# Patient Record
Sex: Male | Born: 1970 | Race: Black or African American | Hispanic: No | Marital: Married | Smoking: Never smoker
Health system: Southern US, Community
[De-identification: ages and names within clinical notes are randomized; demographics above are authoritative.]

## PROBLEM LIST (undated history)

## (undated) DIAGNOSIS — I10 Essential (primary) hypertension: Secondary | ICD-10-CM

## (undated) HISTORY — PX: KNEE ARTHROSCOPY: SHX127

---

## 2001-03-29 ENCOUNTER — Other Ambulatory Visit: Admission: RE | Admit: 2001-03-29 | Discharge: 2001-03-29 | Payer: Self-pay

## 2002-03-19 ENCOUNTER — Emergency Department (HOSPITAL_COMMUNITY): Admission: EM | Admit: 2002-03-19 | Discharge: 2002-03-19 | Payer: Self-pay | Admitting: *Deleted

## 2006-04-15 ENCOUNTER — Emergency Department (HOSPITAL_COMMUNITY): Admission: EM | Admit: 2006-04-15 | Discharge: 2006-04-15 | Payer: Self-pay | Admitting: Emergency Medicine

## 2006-12-09 ENCOUNTER — Emergency Department (HOSPITAL_COMMUNITY): Admission: EM | Admit: 2006-12-09 | Discharge: 2006-12-09 | Payer: Self-pay | Admitting: Emergency Medicine

## 2010-02-04 ENCOUNTER — Emergency Department (HOSPITAL_COMMUNITY): Admission: EM | Admit: 2010-02-04 | Discharge: 2010-02-04 | Payer: Self-pay | Admitting: Emergency Medicine

## 2010-02-06 ENCOUNTER — Ambulatory Visit (HOSPITAL_COMMUNITY): Admission: RE | Admit: 2010-02-06 | Discharge: 2010-02-06 | Payer: Self-pay | Admitting: Internal Medicine

## 2010-04-26 ENCOUNTER — Other Ambulatory Visit (HOSPITAL_COMMUNITY): Payer: Self-pay | Admitting: Orthopaedic Surgery

## 2010-04-26 ENCOUNTER — Ambulatory Visit (HOSPITAL_COMMUNITY): Admission: RE | Admit: 2010-04-26 | Payer: Self-pay | Source: Home / Self Care | Admitting: Orthopaedic Surgery

## 2010-04-26 DIAGNOSIS — M542 Cervicalgia: Secondary | ICD-10-CM

## 2010-05-01 ENCOUNTER — Encounter: Payer: Self-pay | Admitting: Orthopaedic Surgery

## 2010-05-03 ENCOUNTER — Ambulatory Visit (HOSPITAL_COMMUNITY)
Admission: RE | Admit: 2010-05-03 | Discharge: 2010-05-03 | Disposition: A | Discharge status: Home / Self Care | Payer: PRIVATE HEALTH INSURANCE | Source: Ambulatory Visit | Attending: Orthopaedic Surgery | Admitting: Orthopaedic Surgery

## 2010-05-03 DIAGNOSIS — M542 Cervicalgia: Secondary | ICD-10-CM | POA: Insufficient documentation

## 2010-05-03 DIAGNOSIS — M5380 Other specified dorsopathies, site unspecified: Secondary | ICD-10-CM | POA: Insufficient documentation

## 2010-05-03 DIAGNOSIS — M5124 Other intervertebral disc displacement, thoracic region: Secondary | ICD-10-CM | POA: Insufficient documentation

## 2010-05-03 DIAGNOSIS — M5020 Other cervical disc displacement, unspecified cervical region: Secondary | ICD-10-CM | POA: Insufficient documentation

## 2010-05-29 ENCOUNTER — Ambulatory Visit: Payer: Self-pay | Admitting: Anesthesiology

## 2010-06-25 ENCOUNTER — Ambulatory Visit: Payer: Self-pay | Admitting: Anesthesiology

## 2010-07-25 ENCOUNTER — Ambulatory Visit: Payer: Self-pay | Admitting: Anesthesiology

## 2010-10-16 ENCOUNTER — Emergency Department (HOSPITAL_COMMUNITY)
Admission: EM | Admit: 2010-10-16 | Discharge: 2010-10-16 | Disposition: A | Discharge status: Home / Self Care | Payer: PRIVATE HEALTH INSURANCE | Attending: Emergency Medicine | Admitting: Emergency Medicine

## 2010-10-16 ENCOUNTER — Encounter: Payer: Self-pay | Admitting: *Deleted

## 2010-10-16 DIAGNOSIS — Z0000 Encounter for general adult medical examination without abnormal findings: Secondary | ICD-10-CM

## 2010-10-16 DIAGNOSIS — I10 Essential (primary) hypertension: Secondary | ICD-10-CM | POA: Insufficient documentation

## 2010-10-16 DIAGNOSIS — Z711 Person with feared health complaint in whom no diagnosis is made: Secondary | ICD-10-CM | POA: Insufficient documentation

## 2010-10-16 HISTORY — DX: Essential (primary) hypertension: I10

## 2010-10-16 NOTE — ED Notes (Signed)
Pt gave himself the shot tonight.

## 2010-10-16 NOTE — ED Provider Notes (Signed)
History     Chief Complaint  Patient presents with  . Foreign Body in Skin   HPI Comments: Patient gave himself a testosterone shot in the left buttock yesterday. Did not see the needle after giving himself the injection. He has the packaging from the syringe. It has a retracting needle mechanism. He is having no pain at the injection site, there is no palpable foreign body. No focal eryhtema or swelling.  Patient is a 40 y.o. male presenting with foreign body. The history is provided by the patient.  Foreign Body  The current episode started yesterday. Intake: gave himself a testosterone shot, doesn't know if needle is still in his buttock. Associated symptoms comments: No symptoms, no pain at the site of injection, no erythema. He has received no recent medical care.    Past Medical History  Diagnosis Date  . Hypertension     History reviewed. No pertinent past surgical history.  History reviewed. No pertinent family history.  History  Substance Use Topics  . Smoking status: Current Everyday Smoker  . Smokeless tobacco: Not on file  . Alcohol Use: No      Review of Systems  All other systems reviewed and are negative.    Physical Exam  BP 127/88  Pulse 84  Temp 97.8 F (36.6 C)  Resp 20  Ht 6\' 1"  (1.854 m)  Wt 190 lb (86.183 kg)  BMI 25.07 kg/m2  SpO2 99%  Physical Exam  Nursing note and vitals reviewed. Constitutional: He is oriented to person, place, and time. He appears well-developed and well-nourished. No distress.  HENT:  Head: Normocephalic and atraumatic.  Eyes: EOM are normal.  Cardiovascular: Normal rate and normal heart sounds.   Pulmonary/Chest: Effort normal and breath sounds normal.  Genitourinary:       Left buttock with no obvious signs of injection at site indicated by patient.  Neurological: He is alert and oriented to person, place, and time.  Skin: Skin is warm and dry.    ED Course  Procedures  MDM       Nicoletta Dress. Colon Branch,  MD 10/16/10 [phone]

## 2010-10-16 NOTE — ED Notes (Signed)
Pt gives himself a testosterone shot q 4wks and he believes the needle may still be in his buttocks.

## 2012-02-19 ENCOUNTER — Ambulatory Visit: Payer: Self-pay | Admitting: Orthopedic Surgery

## 2013-12-13 ENCOUNTER — Ambulatory Visit (INDEPENDENT_AMBULATORY_CARE_PROVIDER_SITE_OTHER): Payer: 59 | Admitting: Urology

## 2013-12-13 DIAGNOSIS — R300 Dysuria: Secondary | ICD-10-CM

## 2013-12-13 DIAGNOSIS — N529 Male erectile dysfunction, unspecified: Secondary | ICD-10-CM

## 2013-12-13 DIAGNOSIS — R972 Elevated prostate specific antigen [PSA]: Secondary | ICD-10-CM

## 2014-02-28 ENCOUNTER — Ambulatory Visit (INDEPENDENT_AMBULATORY_CARE_PROVIDER_SITE_OTHER): Payer: 59 | Admitting: Urology

## 2014-02-28 ENCOUNTER — Other Ambulatory Visit: Payer: Self-pay | Admitting: Urology

## 2014-02-28 DIAGNOSIS — R972 Elevated prostate specific antigen [PSA]: Secondary | ICD-10-CM

## 2014-03-30 ENCOUNTER — Other Ambulatory Visit: Payer: Self-pay | Admitting: Urology

## 2014-03-30 DIAGNOSIS — R972 Elevated prostate specific antigen [PSA]: Secondary | ICD-10-CM

## 2014-05-02 ENCOUNTER — Ambulatory Visit (HOSPITAL_COMMUNITY)
Admission: RE | Admit: 2014-05-02 | Discharge: 2014-05-02 | Disposition: A | Discharge status: Home / Self Care | Payer: 59 | Source: Ambulatory Visit | Attending: Urology | Admitting: Urology

## 2014-05-02 ENCOUNTER — Encounter (HOSPITAL_COMMUNITY): Payer: Self-pay

## 2014-05-02 DIAGNOSIS — C61 Malignant neoplasm of prostate: Secondary | ICD-10-CM | POA: Diagnosis not present

## 2014-05-02 DIAGNOSIS — R972 Elevated prostate specific antigen [PSA]: Secondary | ICD-10-CM | POA: Insufficient documentation

## 2014-05-02 MED ORDER — LIDOCAINE HCL (PF) 2 % IJ SOLN
INTRAMUSCULAR | Status: AC
  Administered 2014-05-02: 10 mL
  Filled 2014-05-02: qty 10

## 2014-05-02 MED ORDER — LIDOCAINE HCL (PF) 2 % IJ SOLN
10.0000 mL | Freq: Once | INTRAMUSCULAR | Status: AC
  Administered 2014-05-02: 10 mL

## 2014-05-02 MED ORDER — GENTAMICIN SULFATE 40 MG/ML IJ SOLN
160.0000 mg | Freq: Once | INTRAMUSCULAR | Status: AC
  Administered 2014-05-02: 160 mg via INTRAMUSCULAR

## 2014-05-02 MED ORDER — GENTAMICIN SULFATE 40 MG/ML IJ SOLN
INTRAMUSCULAR | Status: AC
  Administered 2014-05-02: 160 mg via INTRAMUSCULAR
  Filled 2014-05-02: qty 4

## 2014-05-02 NOTE — Discharge Instructions (Signed)
Transrectal Ultrasound-Guided Biopsy °A transrectal ultrasound-guided biopsy is a procedure to remove samples of tissue from your prostate using ultrasound images to guide the procedure. The procedure is usually done to evaluate the prostate gland of men who have an elevated prostate-specific antigen (PSA). PSA is a blood test to screen for prostate cancer. The biopsy samples are taken to check for prostate cancer.  °LET YOUR HEALTH CARE PROVIDER KNOW ABOUT: °· Any allergies you have. °· All medicines you are taking, including vitamins, herbs, eye drops, creams, and over-the-counter medicines. °· Previous problems you or members of your family have had with the use of anesthetics. °· Any blood disorders you have. °· Previous surgeries you have had. °· Medical conditions you have. °RISKS AND COMPLICATIONS °Generally, this is a safe procedure. However, as with any procedure, problems can occur. Possible problems include: °· Infection of your prostate. °· Bleeding from your rectum or blood in your urine. °· Difficulty urinating. °· Nerve damage (this is usually temporary). °· Damage to surrounding structures such as blood vessels, organs, and muscles, which would require other procedures. °BEFORE THE PROCEDURE °· Do not eat or drink anything after midnight on the night before the procedure or as directed by your health care provider. °· Take medicines only as directed by your health care provider. °· Your health care provider may have you stop taking certain medicines 5-7 days before the procedure. °· You will be given an enema before the procedure. During an enema, a liquid is injected into your rectum to clear out waste. °· You may have lab tests the day of your procedure.   °· Plan to have someone take you home after the procedure. °PROCEDURE  °· You will be given medicine to help you relax (sedative) before the procedure. An IV tube will be inserted into one of your veins and used to give fluids and  medicine. °· You will be given antibiotic medicine to reduce the risk of an infection. °· You will be placed on your side for the procedure. °· A probe with lubricated gel will be placed into your rectum, and images will be taken of your prostate and surrounding structures. °· Numbing medicine will be injected into the prostate before the biopsy samples are taken. °· A biopsy needle will then be inserted and guided to your prostate with the use of the ultrasound images. °· Samples of prostate tissue will be taken, and the needle will then be removed. °· The biopsy samples will be sent to a lab to be analyzed. Results are usually back in 2-3 days. °AFTER THE PROCEDURE °· You will be taken to a recovery area where you will be monitored. °· You may have some discomfort in the rectal area. You will be given pain medicines to control this. °· You may be allowed to go home the same day, or you may need to stay in the hospital overnight. °Document Released: 08/01/2013 Document Reviewed: 11/03/2012 °[employer]® Patient Information ©2015 [employer]. This information is not intended to replace advice given to you by your health care provider. Make sure you discuss any questions you have with your health care provider. ° °

## 2014-10-19 ENCOUNTER — Other Ambulatory Visit: Payer: Self-pay | Admitting: Urology

## 2014-10-19 DIAGNOSIS — C61 Malignant neoplasm of prostate: Secondary | ICD-10-CM

## 2014-11-08 ENCOUNTER — Ambulatory Visit (HOSPITAL_COMMUNITY)
Admission: RE | Admit: 2014-11-08 | Discharge: 2014-11-08 | Disposition: A | Discharge status: Home / Self Care | Payer: 59 | Source: Ambulatory Visit | Attending: Urology | Admitting: Urology

## 2014-11-08 DIAGNOSIS — C61 Malignant neoplasm of prostate: Secondary | ICD-10-CM | POA: Diagnosis not present

## 2014-11-08 LAB — POCT I-STAT CREATININE: CREATININE: 1.1 mg/dL (ref 0.61–1.24)

## 2014-11-08 MED ORDER — GADOBENATE DIMEGLUMINE 529 MG/ML IV SOLN
20.0000 mL | Freq: Once | INTRAVENOUS | Status: AC | PRN
  Administered 2014-11-08: 18 mL via INTRAVENOUS

## 2015-05-09 DIAGNOSIS — Z79899 Other long term (current) drug therapy: Secondary | ICD-10-CM | POA: Diagnosis not present

## 2015-05-09 DIAGNOSIS — I10 Essential (primary) hypertension: Secondary | ICD-10-CM | POA: Diagnosis not present

## 2015-05-09 DIAGNOSIS — C61 Malignant neoplasm of prostate: Secondary | ICD-10-CM | POA: Diagnosis not present

## 2015-05-09 DIAGNOSIS — D075 Carcinoma in situ of prostate: Secondary | ICD-10-CM | POA: Diagnosis not present

## 2015-05-18 DIAGNOSIS — N528 Other male erectile dysfunction: Secondary | ICD-10-CM | POA: Diagnosis not present

## 2015-05-18 DIAGNOSIS — Z0001 Encounter for general adult medical examination with abnormal findings: Secondary | ICD-10-CM | POA: Diagnosis not present

## 2015-05-18 DIAGNOSIS — Z8546 Personal history of malignant neoplasm of prostate: Secondary | ICD-10-CM | POA: Diagnosis not present

## 2015-05-18 DIAGNOSIS — Z6825 Body mass index (BMI) 25.0-25.9, adult: Secondary | ICD-10-CM | POA: Diagnosis not present

## 2015-05-19 DIAGNOSIS — H524 Presbyopia: Secondary | ICD-10-CM | POA: Diagnosis not present

## 2015-05-23 DIAGNOSIS — C61 Malignant neoplasm of prostate: Secondary | ICD-10-CM | POA: Diagnosis not present

## 2015-05-23 DIAGNOSIS — Z0000 Encounter for general adult medical examination without abnormal findings: Secondary | ICD-10-CM | POA: Diagnosis not present

## 2015-10-10 DIAGNOSIS — M7581 Other shoulder lesions, right shoulder: Secondary | ICD-10-CM | POA: Diagnosis not present

## 2015-11-13 DIAGNOSIS — C61 Malignant neoplasm of prostate: Secondary | ICD-10-CM | POA: Diagnosis not present

## 2015-11-21 DIAGNOSIS — N5201 Erectile dysfunction due to arterial insufficiency: Secondary | ICD-10-CM | POA: Diagnosis not present

## 2015-11-21 DIAGNOSIS — C61 Malignant neoplasm of prostate: Secondary | ICD-10-CM | POA: Diagnosis not present

## 2015-11-27 DIAGNOSIS — I10 Essential (primary) hypertension: Secondary | ICD-10-CM | POA: Diagnosis not present

## 2016-01-07 ENCOUNTER — Encounter: Payer: Self-pay | Admitting: Anesthesiology

## 2016-01-07 ENCOUNTER — Ambulatory Visit: Payer: 59 | Attending: Anesthesiology | Admitting: Anesthesiology

## 2016-01-07 VITALS — BP 124/81 | HR 63 | Temp 98.0°F | Resp 16 | Ht 73.0 in | Wt 190.0 lb

## 2016-01-07 DIAGNOSIS — M5136 Other intervertebral disc degeneration, lumbar region: Secondary | ICD-10-CM | POA: Insufficient documentation

## 2016-01-07 DIAGNOSIS — F17200 Nicotine dependence, unspecified, uncomplicated: Secondary | ICD-10-CM | POA: Insufficient documentation

## 2016-01-07 DIAGNOSIS — M25511 Pain in right shoulder: Secondary | ICD-10-CM | POA: Insufficient documentation

## 2016-01-07 DIAGNOSIS — I10 Essential (primary) hypertension: Secondary | ICD-10-CM | POA: Diagnosis not present

## 2016-01-07 DIAGNOSIS — M7918 Myalgia, other site: Secondary | ICD-10-CM

## 2016-01-07 DIAGNOSIS — M542 Cervicalgia: Secondary | ICD-10-CM | POA: Diagnosis not present

## 2016-01-07 DIAGNOSIS — M791 Myalgia: Secondary | ICD-10-CM | POA: Diagnosis not present

## 2016-01-07 MED ORDER — ROPIVACAINE HCL 2 MG/ML IJ SOLN: 1.0000 mL | Freq: Once | INTRAMUSCULAR | Status: AC

## 2016-01-07 MED ORDER — DEXAMETHASONE SODIUM PHOSPHATE 10 MG/ML IJ SOLN
INTRAMUSCULAR | Status: AC
  Filled 2016-01-07: qty 1

## 2016-01-07 MED ORDER — ROPIVACAINE HCL 2 MG/ML IJ SOLN
INTRAMUSCULAR | Status: AC
  Filled 2016-01-07: qty 10

## 2016-01-07 MED ORDER — DEXAMETHASONE SODIUM PHOSPHATE 10 MG/ML IJ SOLN: 10.0000 mg | Freq: Once | INTRAMUSCULAR | Status: AC

## 2016-01-07 NOTE — Patient Instructions (Addendum)
General Discharge Instructions :  If you need to reach your doctor call: Monday-Friday 8:00 am - 4:00 pm at [phone] or toll free [phone].  After clinic hours [phone] to have operator reach doctor.  Bring all of your medication bottles to all your appointments in the pain clinic.  To cancel or reschedule your appointment with Pain Management please remember to call 24 hours in advance to avoid a fee.  Refer to the educational materials which you have been given on: General Risks, I had my Procedure. Discharge Instructions, Post Sedation.  Post Procedure Instructions:  The drugs you were given will stay in your system until tomorrow, so for the next 24 hours you should not drive, make any legal decisions or drink any alcoholic beverages.  You may eat anything you prefer, but it is better to start with liquids then soups and crackers, and gradually work up to solid foods.  Please notify your doctor immediately if you have any unusual bleeding, trouble breathing or pain that is not related to your normal pain.  Depending on the type of procedure that was done, some parts of your body may feel week and/or numb.  This usually clears up by tonight or the next day.  Walk with the use of an assistive device or accompanied by an adult for the 24 hours.  You may use ice on the affected area for the first 24 hours.  Put ice in a Ziploc bag and cover with a towel and place against area 15 minutes on 15 minutes off.  You may switch to heat after 24 hours. Trigger Point Injection Trigger points are areas where you have muscle pain. A trigger point injection is a shot given in the trigger point to relieve that pain. A trigger point might feel like a knot in your muscle. It hurts to press on a trigger point. Sometimes the pain spreads out (radiates) to other parts of the body. For example, pressing on a trigger point in your shoulder might cause pain in your arm or neck. You might have one  trigger point. Or, you might have more than one. People often have trigger points in their upper back and lower back. They also occur often in the neck and shoulders. Pain from a trigger point lasts for a long time. It can make it hard to keep moving. You might not be able to do the exercise or physical therapy that could help you deal with the pain. A trigger point injection may help. It does not work for everyone. But, it may relieve your pain for a few days or a few months. A trigger point injection does not cure long-lasting (chronic) pain. LET YOUR CAREGIVER KNOW ABOUT: Any allergies (especially to latex, lidocaine, or steroids). Blood-thinning medicines that you take. These drugs can lead to bleeding or bruising after an injection. They include: Aspirin. Ibuprofen. Clopidogrel. Warfarin. Other medicines you take. This includes all vitamins, herbs, eyedrops, over-the-counter medicines, and creams. Use of steroids. Recent infections. Past problems with numbing medicines. Bleeding problems. Surgeries you have had. Other health problems. RISKS AND COMPLICATIONS A trigger point injection is a safe treatment. However, problems may develop, such as: Minor side effects usually go away in 1 to 2 days. These may include: Soreness. Bruising. Stiffness. More serious problems are rare. But, they may include: Bleeding under the skin (hematoma). Skin infection. Breaking off of the needle under your skin. Lung puncture. The trigger point injection may not work for you. BEFORE THE PROCEDURE  You may need to stop taking any medicine that thins your blood. This is to prevent bleeding and bruising. Usually these medicines are stopped several days before the injection. No other preparation is needed. PROCEDURE  A trigger point injection can be given in your caregiver's office or in a clinic. Each injection takes 2 minutes or less. Your caregiver will feel for trigger points. The caregiver may use a  marker to circle the area for the injection. The skin over the trigger point will be washed with a germ-killing (antiseptic) solution. The caregiver pinches the spot for the injection. Then, a very thin needle is used for the shot. You may feel pain or a twitching feeling when the needle enters the trigger point. A numbing solution may be injected into the trigger point. Sometimes a drug to keep down swelling, redness, and warmth (inflammation) is also injected. Your caregiver moves the needle around the trigger zone until the tightness and twitching goes away. After the injection, your caregiver may put gentle pressure over the injection site. Then it is covered with a bandage. AFTER THE PROCEDURE You can go right home after the injection. The bandage can be taken off after a few hours. You may feel sore and stiff for 1 to 2 days. Go back to your regular activities slowly. Your caregiver may ask you to stretch your muscles. Do not do anything that takes extra energy for a few days. Follow your caregiver's instructions to manage and treat other pain.   This information is not intended to replace advice given to you by your health care provider. Make sure you discuss any questions you have with your health care provider.   Document Released: 03/06/2011 Document Revised: 07/12/2012 Document Reviewed: 03/06/2011 Elsevier Interactive Patient Education 2016 [employer] Pain Management Discharge Instructions  General Discharge Instructions :  If you need to reach your doctor call: Monday-Friday 8:00 am - 4:00 pm at [phone] or toll free [phone].  After clinic hours [phone] to have operator reach doctor.  Bring all of your medication bottles to all your appointments in the pain clinic.  To cancel or reschedule your appointment with Pain Management please remember to call 24 hours in advance to avoid a fee.  Refer to the educational materials which you have been given on:  General Risks, I had my Procedure. Discharge Instructions, Post Sedation.  Post Procedure Instructions:  The drugs you were given will stay in your system until tomorrow, so for the next 24 hours you should not drive, make any legal decisions or drink any alcoholic beverages.  You may eat anything you prefer, but it is better to start with liquids then soups and crackers, and gradually work up to solid foods.  Please notify your doctor immediately if you have any unusual bleeding, trouble breathing or pain that is not related to your normal pain.  Depending on the type of procedure that was done, some parts of your body may feel week and/or numb.  This usually clears up by tonight or the next day.  Walk with the use of an assistive device or accompanied by an adult for the 24 hours.  You may use ice on the affected area for the first 24 hours.  Put ice in a Ziploc bag and cover with a towel and place against area 15 minutes on 15 minutes off.  You may switch to heat after 24 hours. Trigger Point Injection Trigger points are areas where you have muscle pain. A trigger point  injection is a shot given in the trigger point to relieve that pain. A trigger point might feel like a knot in your muscle. It hurts to press on a trigger point. Sometimes the pain spreads out (radiates) to other parts of the body. For example, pressing on a trigger point in your shoulder might cause pain in your arm or neck. You might have one trigger point. Or, you might have more than one. People often have trigger points in their upper back and lower back. They also occur often in the neck and shoulders. Pain from a trigger point lasts for a long time. It can make it hard to keep moving. You might not be able to do the exercise or physical therapy that could help you deal with the pain. A trigger point injection may help. It does not work for everyone. But, it may relieve your pain for a few days or a few months. A trigger  point injection does not cure long-lasting (chronic) pain. LET YOUR CAREGIVER KNOW ABOUT:  Any allergies (especially to latex, lidocaine, or steroids).  Blood-thinning medicines that you take. These drugs can lead to bleeding or bruising after an injection. They include:  Aspirin.  Ibuprofen.  Clopidogrel.  Warfarin.  Other medicines you take. This includes all vitamins, herbs, eyedrops, over-the-counter medicines, and creams.  Use of steroids.  Recent infections.  Past problems with numbing medicines.  Bleeding problems.  Surgeries you have had.  Other health problems. RISKS AND COMPLICATIONS A trigger point injection is a safe treatment. However, problems may develop, such as:  Minor side effects usually go away in 1 to 2 days. These may include:  Soreness.  Bruising.  Stiffness.  More serious problems are rare. But, they may include:  Bleeding under the skin (hematoma).  Skin infection.  Breaking off of the needle under your skin.  Lung puncture.  The trigger point injection may not work for you. BEFORE THE PROCEDURE You may need to stop taking any medicine that thins your blood. This is to prevent bleeding and bruising. Usually these medicines are stopped several days before the injection. No other preparation is needed. PROCEDURE  A trigger point injection can be given in your caregiver's office or in a clinic. Each injection takes 2 minutes or less.  Your caregiver will feel for trigger points. The caregiver may use a marker to circle the area for the injection.  The skin over the trigger point will be washed with a germ-killing (antiseptic) solution.  The caregiver pinches the spot for the injection.  Then, a very thin needle is used for the shot. You may feel pain or a twitching feeling when the needle enters the trigger point.  A numbing solution may be injected into the trigger point. Sometimes a drug to keep down swelling, redness, and warmth  (inflammation) is also injected.  Your caregiver moves the needle around the trigger zone until the tightness and twitching goes away.  After the injection, your caregiver may put gentle pressure over the injection site.  Then it is covered with a bandage. AFTER THE PROCEDURE  You can go right home after the injection.  The bandage can be taken off after a few hours.  You may feel sore and stiff for 1 to 2 days.  Go back to your regular activities slowly. Your caregiver may ask you to stretch your muscles. Do not do anything that takes extra energy for a few days.  Follow your caregiver's instructions to manage and  treat other pain.   This information is not intended to replace advice given to you by your health care provider. Make sure you discuss any questions you have with your health care provider.   Document Released: 03/06/2011 Document Revised: 07/12/2012 Document Reviewed: 03/06/2011 Elsevier Interactive Patient Education 2016 [place]  What are the risk, side effects and possible complications? Generally speaking, most procedures are safe.  However, with any procedure there are risks, side effects, and the possibility of complications.  The risks and complications are dependent upon the sites that are lesioned, or the type of nerve block to be performed.  The closer the procedure is to the spine, the more serious the risks are.  Great care is taken when placing the radio frequency needles, block needles or lesioning probes, but sometimes complications can occur. 1. Infection: Any time there is an injection through the skin, there is a risk of infection.  This is why sterile conditions are used for these blocks.  There are four possible types of infection. 1. Localized skin infection. 2. Central Nervous System Infection-This can be in the form of Meningitis, which can be deadly. 3. Epidural Infections-This can be in the form of an epidural  abscess, which can cause pressure inside of the spine, causing compression of the spinal cord with subsequent paralysis. This would require an emergency surgery to decompress, and there are no guarantees that the patient would recover from the paralysis. 4. Discitis-This is an infection of the intervertebral discs.  It occurs in about 1% of discography procedures.  It is difficult to treat and it may lead to surgery.        2. Pain: the needles have to go through skin and soft tissues, will cause soreness.       3. Damage to internal structures:  The nerves to be lesioned may be near blood vessels or    other nerves which can be potentially damaged.       4. Bleeding: Bleeding is more common if the patient is taking blood thinners such as  aspirin, Coumadin, Ticiid, Plavix, etc., or if he/she have some genetic predisposition  such as hemophilia. Bleeding into the spinal canal can cause compression of the spinal  cord with subsequent paralysis.  This would require an emergency surgery to  decompress and there are no guarantees that the patient would recover from the  paralysis.       5. Pneumothorax:  Puncturing of a lung is a possibility, every time a needle is introduced in  the area of the chest or upper back.  Pneumothorax refers to free air around the  collapsed lung(s), inside of the thoracic cavity (chest cavity).  Another two possible  complications related to a similar event would include: Hemothorax and Chylothorax.   These are variations of the Pneumothorax, where instead of air around the collapsed  lung(s), you may have blood or chyle, respectively.       6. Spinal headaches: They may occur with any procedures in the area of the spine.       7. Persistent CSF (Cerebro-Spinal Fluid) leakage: This is a rare problem, but may occur  with prolonged intrathecal or epidural catheters either due to the formation of a fistulous  track or a dural tear.       8. Nerve damage: By working so close to the  spinal cord, there is always a possibility of  nerve damage, which could be as serious as  a permanent spinal cord injury with  paralysis.       9. Death:  Although rare, severe deadly allergic reactions known as "Anaphylactic  reaction" can occur to any of the medications used.      10. Worsening of the symptoms:  We can always make thing worse.  What are the chances of something like this happening? Chances of any of this occuring are extremely low.  By statistics, you have more of a chance of getting killed in a motor vehicle accident: while driving to the hospital than any of the above occurring .  Nevertheless, you should be aware that they are possibilities.  In general, it is similar to taking a shower.  Everybody knows that you can slip, hit your head and get killed.  Does that mean that you should not shower again?  Nevertheless always keep in mind that statistics do not mean anything if you happen to be on the wrong side of them.  Even if a procedure has a 1 (one) in a 1,000,000 (million) chance of going wrong, it you happen to be that one..Also, keep in mind that by statistics, you have more of a chance of having something go wrong when taking medications.  Who should not have this procedure? If you are on a blood thinning medication (e.g. Coumadin, Plavix, see list of "Blood Thinners"), or if you have an active infection going on, you should not have the procedure.  If you are taking any blood thinners, please inform your physician.  How should I prepare for this procedure?  Do not eat or drink anything at least six hours prior to the procedure.  Bring a driver with you .  It cannot be a taxi.  Come accompanied by an adult that can drive you back, and that is strong enough to help you if your legs get weak or numb from the local anesthetic.  Take all of your medicines the morning of the procedure with just enough water to swallow them.  If you have diabetes, make sure that you are  scheduled to have your procedure done first thing in the morning, whenever possible.  If you have diabetes, take only half of your insulin dose and notify our nurse that you have done so as soon as you arrive at the clinic.  If you are diabetic, but only take blood sugar pills (oral hypoglycemic), then do not take them on the morning of your procedure.  You may take them after you have had the procedure.  Do not take aspirin or any aspirin-containing medications, at least eleven (11) days prior to the procedure.  They may prolong bleeding.  Wear loose fitting clothing that may be easy to take off and that you would not mind if it got stained with Betadine or blood.  Do not wear any jewelry or perfume  Remove any nail coloring.  It will interfere with some of our monitoring equipment.  NOTE: Remember that this is not meant to be interpreted as a complete list of all possible complications.  Unforeseen problems may occur.  BLOOD THINNERS The following drugs contain aspirin or other products, which can cause increased bleeding during surgery and should not be taken for 2 weeks prior to and 1 week after surgery.  If you should need take something for relief of minor pain, you may take acetaminophen which is found in Tylenol,m Datril, Anacin-3 and Panadol. It is not blood thinner. The products listed below are.  Do not  take any of the products listed below in addition to any listed on your instruction sheet.  A.P.C or A.P.C with Codeine Codeine Phosphate Capsules #3 Ibuprofen Ridaura  ABC compound Congesprin Imuran rimadil  Advil Cope Indocin Robaxisal  Alka-Seltzer Effervescent Pain Reliever and Antacid Coricidin or Coricidin-D  Indomethacin Rufen  Alka-Seltzer plus Cold Medicine Cosprin Ketoprofen S-A-C Tablets  Anacin Analgesic Tablets or Capsules Coumadin Korlgesic Salflex  Anacin Extra Strength Analgesic tablets or capsules CP-2 Tablets Lanoril Salicylate  Anaprox Cuprimine Capsules  Levenox Salocol  Anexsia-D Dalteparin Magan Salsalate  Anodynos Darvon compound Magnesium Salicylate Sine-off  Ansaid Dasin Capsules Magsal Sodium Salicylate  Anturane Depen Capsules Marnal Soma  APF Arthritis pain formula Dewitt's Pills Measurin Stanback  Argesic Dia-Gesic Meclofenamic Sulfinpyrazone  Arthritis Bayer Timed Release Aspirin Diclofenac Meclomen Sulindac  Arthritis pain formula Anacin Dicumarol Medipren Supac  Analgesic (Safety coated) Arthralgen Diffunasal Mefanamic Suprofen  Arthritis Strength Bufferin Dihydrocodeine Mepro Compound Suprol  Arthropan liquid Dopirydamole Methcarbomol with Aspirin Synalgos  ASA tablets/Enseals Disalcid Micrainin Tagament  Ascriptin Doan's Midol Talwin  Ascriptin A/D Dolene Mobidin Tanderil  Ascriptin Extra Strength Dolobid Moblgesic Ticlid  Ascriptin with Codeine Doloprin or Doloprin with Codeine Momentum Tolectin  Asperbuf Duoprin Mono-gesic Trendar  Aspergum Duradyne Motrin or Motrin IB Triminicin  Aspirin plain, buffered or enteric coated Durasal Myochrisine Trigesic  Aspirin Suppositories Easprin Nalfon Trillsate  Aspirin with Codeine Ecotrin Regular or Extra Strength Naprosyn Uracel  Atromid-S Efficin Naproxen Ursinus  Auranofin Capsules Elmiron Neocylate Vanquish  Axotal Emagrin Norgesic Verin  Azathioprine Empirin or Empirin with Codeine Normiflo Vitamin E  Azolid Emprazil Nuprin Voltaren  Bayer Aspirin plain, buffered or children's or timed BC Tablets or powders Encaprin Orgaran Warfarin Sodium  Buff-a-Comp Enoxaparin Orudis Zorpin  Buff-a-Comp with Codeine Equegesic Os-Cal-Gesic   Buffaprin Excedrin plain, buffered or Extra Strength Oxalid   Bufferin Arthritis Strength Feldene Oxphenbutazone   Bufferin plain or Extra Strength Feldene Capsules Oxycodone with Aspirin   Bufferin with Codeine Fenoprofen Fenoprofen Pabalate or Pabalate-SF   Buffets II Flogesic Panagesic   Buffinol plain or Extra Strength Florinal or Florinal with  Codeine Panwarfarin   Buf-Tabs Flurbiprofen Penicillamine   Butalbital Compound Four-way cold tablets Penicillin   Butazolidin Fragmin Pepto-Bismol   Carbenicillin Geminisyn Percodan   Carna Arthritis Reliever Geopen Persantine   Carprofen Gold's salt Persistin   Chloramphenicol Goody's Phenylbutazone   Chloromycetin Haltrain Piroxlcam   Clmetidine heparin Plaquenil   Cllnoril Hyco-pap Ponstel   Clofibrate Hydroxy chloroquine Propoxyphen         Before stopping any of these medications, be sure to consult the physician who ordered them.  Some, such as Coumadin (Warfarin) are ordered to prevent or treat serious conditions such as "deep thrombosis", "pumonary embolisms", and other heart problems.  The amount of time that you may need off of the medication may also vary with the medication and the reason for which you were taking it.  If you are taking any of these medications, please make sure you notify your pain physician before you undergo any procedures.

## 2016-01-07 NOTE — Progress Notes (Signed)
Safety precautions to be maintained throughout the outpatient stay will include: orient to surroundings, keep bed in low position, maintain call bell within reach at all times, provide assistance with transfer out of bed and ambulation.  

## 2016-01-08 NOTE — Progress Notes (Signed)
Subjective:  Patient ID: Douglas Benitez, male    DOB: [birth year]  Age: 45 y.o. MRN: [id number]  CC: Neck Pain (right shoulder)      PROCEDURE:Right trapezius trigger point injection 2  HPI CHRISTAPHER Benitez presents for a new patient evaluation. Argyle has a long-standing history of neck and shoulder pain and reports an increasing amount of pain that is radiating into the right posterior shoulder. He occasionally finds that he awakens at night and his hand and arm are completely numb. He has had a history of cervical degenerative disc disease and has had previous cervical epidurals by Dr. Ronelle Nigh in the past. These reportedly gave him significant improvement. He has tried to use anti-inflammatories and proceed with physical therapy modalities without significant improvement in his pain and he is rating his pain as intractable and severe at this time. The upper extremity weakness that he experiences is intermittent in nature.  History Douglas Benitez has a past medical history of Hypertension.   He has a past surgical history that includes Knee arthroscopy.   His family history includes Cancer in his father and mother; Hypertension in his mother.He reports that he has been smoking.  He has never used smokeless tobacco. He reports that he does not drink alcohol or use drugs.  No results found for this or any previous visit.  No results found for: TOXASSSELUR   Lab Results  Component Value Date   CREATININE 1.10 11/08/2014    --------------------------------------------------------------------------------------------------------------------- Mr Prostate W / Wo Cm  Result Date: 11/08/2014 CLINICAL DATA:  Prostate cancer, Gleason score 6 EXAM: MR PROSTATE WITHOUT AND WITH CONTRAST TECHNIQUE: Multiplanar multisequence MRI images were obtained of the pelvis centered about the prostate. Pre and post contrast images were obtained. CONTRAST:  68mL MULTIHANCE GADOBENATE DIMEGLUMINE 529 MG/ML IV  SOLN COMPARISON:  None. FINDINGS: Prostate: 7 mm focal T2 nodule in the medial right peripheral mid gland to apex (series 7/image 17). No definite restricted diffusion. 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 No findings specific for high-grade macroscopic prostate cancer. Nodularity of the central gland which indents the base of the bladder, reflecting benign prostatic hyperplasia. No suspicious central gland nodule on T2. Transcapsular spread:  Absent. Seminal vesicle involvement: Absent. Neurovascular bundle involvement: Absent. Pelvic adenopathy: Absent. Bone metastasis: Absent. Other findings: Bladder is mildly thick-walled although underdistended. IMPRESSION: 7 mm nodule in the medial right peripheral gland apex, likely corresponding to the patient's low grade macroscopic prostate cancer. No evidence of extracapsular extension, seminal vesicle invasion, or metastatic disease. Electronically Signed   By: Julian Hy M.D.   On: 11/08/2014 13:23       ---------------------------------------------------------------------------------------------------------------------- Past Medical History:  Diagnosis Date  . Hypertension     Past Surgical History:  Procedure Laterality Date  . KNEE ARTHROSCOPY      Family History  Problem Relation Age of Onset  . Hypertension Mother   . Cancer Mother   . Cancer Father     Social History  Substance Use Topics  . Smoking status: Current Every Day Smoker  . Smokeless tobacco: Never Used  . Alcohol use No    ---------------------------------------------------------------------------------------------------------------------- Social History   Social History  . Marital status: Married    Spouse name: N/A  . Number of children: N/A  . Years of education: N/A   Social History Main Topics  . Smoking status: Current Every Day Smoker  . Smokeless tobacco: Never Used  . Alcohol use No  . Drug use:  No  . Sexual activity: Not Asked   Other Topics Concern  . None   Social History Narrative  . None    Scheduled Meds: . dexamethasone  10 mg Other Once  . ropivacaine (PF) 2 mg/ml (0.2%)  1 mL Epidural Once   Continuous Infusions:  PRN Meds:.   BP 124/81   Pulse 63   Temp 98 F (36.7 C) (Oral)   Resp 16   Ht 6\' 1"  (1.854 m)   Wt 190 lb (86.2 kg)   SpO2 99%   BMI 25.07 kg/m    BP Readings from Last 3 Encounters:  01/07/16 124/81  10/16/10 127/88     Wt Readings from Last 3 Encounters:  01/07/16 190 lb (86.2 kg)  10/16/10 190 lb (86.2 kg)     ----------------------------------------------------------------------------------------------------------------------  ROS Review of Systems  Cardiac: None Pulmonary: None Neurologic: As above with intermittent right upper extremity numbness and tingling affecting the entire right hand and right shoulder spasming  Objective:  BP 124/81   Pulse 63   Temp 98 F (36.7 C) (Oral)   Resp 16   Ht 6\' 1"  (1.854 m)   Wt 190 lb (86.2 kg)   SpO2 99%   BMI 25.07 kg/m   Physical Exam Patient is alert and oriented 3 cooperative compliant Heart is regular rate and rhythm Lungs are clear to also station Upper extremity strength appears to be at baseline with 5 over 5 strength both proximal and distal to the upper extremities. Hand grasps strength is intact and sensation appears to be grossly intact. Muscle tone and bulk to the upper extremities is good. He has 2 trigger points in the proximal mid body right trapezius. He has good range of motion at the atlantooccipital joint.     Assessment & Plan:   Zekhi was seen today for neck pain.  Diagnoses and all orders for this visit:  DDD (degenerative disc disease), lumbar  Cervicalgia -     TRIGGER POINT INJECTION -     ropivacaine (PF) 2 mg/ml (0.2%) (NAROPIN) epidural 1 mL; 1 mL by Epidural route once. -     dexamethasone (DECADRON) injection 10 mg; 1 mL (10 mg  total) by Other route once.  Muscle pain, myofacial -     TRIGGER POINT INJECTION  Other orders -     ropivacaine (PF) 2 mg/ml (0.2%) (NAROPIN) 2 MG/ML epidural;  -     dexamethasone (DECADRON) 10 MG/ML injection;      ----------------------------------------------------------------------------------------------------------------------  Problem List Items Addressed This Visit    None    Visit Diagnoses    DDD (degenerative disc disease), lumbar    -  Primary   Relevant Medications   dexamethasone (DECADRON) injection 10 mg   Cervicalgia       Relevant Medications   ropivacaine (PF) 2 mg/ml (0.2%) (NAROPIN) epidural 1 mL   dexamethasone (DECADRON) injection 10 mg   Other Relevant Orders   TRIGGER POINT INJECTION   Muscle pain, myofacial       Relevant Orders   TRIGGER POINT INJECTION      ----------------------------------------------------------------------------------------------------------------------  1. DDD (degenerative disc disease), lumbar We will defer on any repeat cervical epidurals at this time in favor of a trigger point injection to the right mid body trapezius. The risks and benefits have been reviewed with him in detail. Furthermore I have reviewed his cervical MRI with him today.  2. Cervicalgia As above and continue with stretching exercises and band exercises as described today. -  TRIGGER POINT INJECTION - ropivacaine (PF) 2 mg/ml (0.2%) (NAROPIN) epidural 1 mL; 1 mL by Epidural route once. - dexamethasone (DECADRON) injection 10 mg; 1 mL (10 mg total) by Other route once.  3. Muscle pain, myofacial As above with return to clinic in 1-2 weeks for possible repeat injection. - TRIGGER POINT INJECTION    ----------------------------------------------------------------------------------------------------------------------  I am having Mr. Stanczak maintain his testosterone cypionate and UNABLE TO FIND. We will continue to administer ropivacaine (PF) 2  mg/ml (0.2%) and dexamethasone.   Meds ordered this encounter  Medications  . ropivacaine (PF) 2 mg/ml (0.2%) (NAROPIN) epidural 1 mL  . dexamethasone (DECADRON) injection 10 mg  . ropivacaine (PF) 2 mg/ml (0.2%) (NAROPIN) 2 MG/ML epidural    Willeen Cass L: cabinet override  . dexamethasone (DECADRON) 10 MG/ML injection    Willeen Cass L: cabinet override    Trigger point injection: The area overlying the aforementioned trigger points were prepped with alcohol. They were then injected with a 25-gauge needle with 4 cc of ropivacaine 0.2% and Decadron 4 mg at each site after negative aspiration for heme. This was performed after informed consent was obtained and risks and benefits reviewed. She tolerated this procedure without difficulty and was convalesced and discharged to home in stable condition for follow-up as mentioned.  @James  Adams, MD@   Follow-up: Return in about 2 weeks (around 01/21/2016) for procedure.    Molli Barrows, MD  This dictation was performed utilizing Dragon voice recognition software.  Please excuse any unintentional or mistaken typographical errors as a result of its unedited utilization.

## 2016-01-30 ENCOUNTER — Ambulatory Visit: Payer: 59 | Attending: Anesthesiology | Admitting: Anesthesiology

## 2016-01-30 ENCOUNTER — Encounter: Payer: Self-pay | Admitting: Anesthesiology

## 2016-01-30 VITALS — BP 131/78 | HR 85 | Temp 97.9°F | Resp 16 | Ht 73.0 in | Wt 189.0 lb

## 2016-01-30 DIAGNOSIS — M7918 Myalgia, other site: Secondary | ICD-10-CM

## 2016-01-30 DIAGNOSIS — M5030 Other cervical disc degeneration, unspecified cervical region: Secondary | ICD-10-CM | POA: Diagnosis not present

## 2016-01-30 DIAGNOSIS — M791 Myalgia: Secondary | ICD-10-CM | POA: Diagnosis not present

## 2016-01-30 DIAGNOSIS — M542 Cervicalgia: Secondary | ICD-10-CM | POA: Insufficient documentation

## 2016-01-30 DIAGNOSIS — M5412 Radiculopathy, cervical region: Secondary | ICD-10-CM

## 2016-01-30 MED ORDER — DEXAMETHASONE SODIUM PHOSPHATE 10 MG/ML IJ SOLN
INTRAMUSCULAR | Status: AC
Start: 2016-01-30 — End: 2016-01-30
  Administered 2016-01-30: 10 mg via INTRAMUSCULAR
  Filled 2016-01-30: qty 1

## 2016-01-30 MED ORDER — ROPIVACAINE HCL 2 MG/ML IJ SOLN: 10.0000 mL | Freq: Once | INTRAMUSCULAR | Status: AC

## 2016-01-30 MED ORDER — ROPIVACAINE HCL 2 MG/ML IJ SOLN
INTRAMUSCULAR | Status: AC
  Administered 2016-01-30: 9 mL via INTRAMUSCULAR
  Filled 2016-01-30: qty 10

## 2016-01-30 MED ORDER — DEXAMETHASONE SODIUM PHOSPHATE 10 MG/ML IJ SOLN: 10.0000 mg | Freq: Once | INTRAMUSCULAR | Status: AC

## 2016-02-26 ENCOUNTER — Other Ambulatory Visit: Payer: Self-pay | Admitting: Gastroenterology

## 2016-02-26 MED ORDER — AZITHROMYCIN 250 MG PO TABS: ORAL_TABLET | ORAL | 0 refills | Status: DC

## 2016-03-03 ENCOUNTER — Ambulatory Visit: Payer: 59 | Attending: Anesthesiology | Admitting: Anesthesiology

## 2016-03-03 ENCOUNTER — Encounter: Payer: Self-pay | Admitting: Anesthesiology

## 2016-03-03 DIAGNOSIS — I10 Essential (primary) hypertension: Secondary | ICD-10-CM | POA: Insufficient documentation

## 2016-03-03 DIAGNOSIS — M25511 Pain in right shoulder: Secondary | ICD-10-CM | POA: Insufficient documentation

## 2016-03-03 DIAGNOSIS — M542 Cervicalgia: Secondary | ICD-10-CM | POA: Diagnosis not present

## 2016-03-03 DIAGNOSIS — M5136 Other intervertebral disc degeneration, lumbar region: Secondary | ICD-10-CM | POA: Insufficient documentation

## 2016-03-03 DIAGNOSIS — Z8249 Family history of ischemic heart disease and other diseases of the circulatory system: Secondary | ICD-10-CM | POA: Diagnosis not present

## 2016-03-03 DIAGNOSIS — Z809 Family history of malignant neoplasm, unspecified: Secondary | ICD-10-CM | POA: Insufficient documentation

## 2016-03-03 DIAGNOSIS — M791 Myalgia: Secondary | ICD-10-CM | POA: Diagnosis not present

## 2016-03-03 MED ORDER — ROPIVACAINE HCL 2 MG/ML IJ SOLN: 10.0000 mL | Freq: Once | INTRAMUSCULAR | Status: AC

## 2016-03-03 MED ORDER — DEXAMETHASONE SODIUM PHOSPHATE 4 MG/ML IJ SOLN
INTRAMUSCULAR | Status: AC
  Filled 2016-03-03: qty 2

## 2016-03-03 MED ORDER — ROPIVACAINE HCL 2 MG/ML IJ SOLN
INTRAMUSCULAR | Status: AC
  Filled 2016-03-03: qty 10

## 2016-03-03 MED ORDER — DEXAMETHASONE SODIUM PHOSPHATE 4 MG/ML IJ SOLN: 4.0000 mg | Freq: Once | INTRAMUSCULAR | Status: AC

## 2016-03-04 NOTE — Progress Notes (Signed)
Subjective:  Patient ID: Douglas Benitez, male    DOB: [birth year]  Age: 45 y.o. MRN: [id number]  CC: Neck Pain (right)   Service Provided on Last Visit: Procedure  PROCEDURE:Right trapezius trigger point injection 2 #2  HPI Douglas Benitez presents for reevaluation and continues to do well following his recent injection. He is doing his stretching exercises and feels that the trigger point injection helped improve his overall pain condition. The frequency of his numbness and tingling in the right hand has diminished and spasming has improved. He still having some pain but it is less severe at this time.   History Douglas Benitez has a past medical history of Hypertension.   He has a past surgical history that includes Knee arthroscopy.   His family history includes Cancer in his father and mother; Hypertension in his mother.He reports that he has never smoked. He has never used smokeless tobacco. He reports that he does not drink alcohol or use drugs.  No results found for this or any previous visit.  No results found for: TOXASSSELUR   Lab Results  Component Value Date   CREATININE 1.10 11/08/2014    --------------------------------------------------------------------------------------------------------------------- Douglas Prostate W / Wo Cm  Result Date: 11/08/2014 CLINICAL DATA:  Prostate cancer, Gleason score 6 EXAM: Douglas PROSTATE WITHOUT AND WITH CONTRAST TECHNIQUE: Multiplanar multisequence MRI images were obtained of the pelvis centered about the prostate. Pre and post contrast images were obtained. CONTRAST:  60mL MULTIHANCE GADOBENATE DIMEGLUMINE 529 MG/ML IV SOLN COMPARISON:  None. FINDINGS: Prostate: 7 mm focal T2 nodule in the medial right peripheral mid gland to apex (series 7/image 17). No definite restricted diffusion. 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 No findings specific for high-grade macroscopic prostate cancer. Nodularity  of the central gland which indents the base of the bladder, reflecting benign prostatic hyperplasia. No suspicious central gland nodule on T2. Transcapsular spread:  Absent. Seminal vesicle involvement: Absent. Neurovascular bundle involvement: Absent. Pelvic adenopathy: Absent. Bone metastasis: Absent. Other findings: Bladder is mildly thick-walled although underdistended. IMPRESSION: 7 mm nodule in the medial right peripheral gland apex, likely corresponding to the patient's low grade macroscopic prostate cancer. No evidence of extracapsular extension, seminal vesicle invasion, or metastatic disease. Electronically Signed   By: Julian Hy M.D.   On: 11/08/2014 13:23       ---------------------------------------------------------------------------------------------------------------------- Past Medical History:  Diagnosis Date  . Hypertension     Past Surgical History:  Procedure Laterality Date  . KNEE ARTHROSCOPY      Family History  Problem Relation Age of Onset  . Hypertension Mother   . Cancer Mother   . Cancer Father     Social History  Substance Use Topics  . Smoking status: Never Smoker  . Smokeless tobacco: Never Used  . Alcohol use No    ---------------------------------------------------------------------------------------------------------------------- Social History   Social History  . Marital status: Married    Spouse name: N/A  . Number of children: N/A  . Years of education: N/A   Social History Main Topics  . Smoking status: Never Smoker  . Smokeless tobacco: Never Used  . Alcohol use No  . Drug use: No  . Sexual activity: Not Asked   Other Topics Concern  . None   Social History Narrative  . None    Scheduled Meds: . dexamethasone  10 mg Other Once  . dexamethasone  10 mg Other Once  . dexamethasone  4 mg Other Once  . ropivacaine (PF) 2  mg/mL (0.2%)  1 mL Epidural Once  . ropivacaine (PF) 2 mg/mL (0.2%)  10 mL Epidural Once  .  ropivacaine (PF) 2 mg/mL (0.2%)  10 mL Epidural Once   Continuous Infusions: PRN Meds:.   BP 131/78 (BP Location: Right Arm, Patient Position: Sitting, Cuff Size: Large)   Pulse 85   Temp 97.9 F (36.6 C) (Oral)   Resp 16   Ht 6\' 1"  (1.854 m)   Wt 189 lb (85.7 kg)   SpO2 100%   BMI 24.94 kg/m    BP Readings from Last 3 Encounters:  03/03/16 132/83  01/30/16 131/78  01/07/16 124/81     Wt Readings from Last 3 Encounters:  03/03/16 198 lb (89.8 kg)  01/30/16 189 lb (85.7 kg)  01/07/16 190 lb (86.2 kg)     ----------------------------------------------------------------------------------------------------------------------  ROS Review of Systems  Cardiac: None Pulmonary: None Neurologic: As above with intermittent right upper extremity numbness and tingling affecting the entire right hand and right shoulder spasming  Objective:  BP 131/78 (BP Location: Right Arm, Patient Position: Sitting, Cuff Size: Large)   Pulse 85   Temp 97.9 F (36.6 C) (Oral)   Resp 16   Ht 6\' 1"  (1.854 m)   Wt 189 lb (85.7 kg)   SpO2 100%   BMI 24.94 kg/m   Physical Exam Patient is alert and oriented 3 cooperative compliant Heart is regular rate and rhythm Lungs are clear to also station One trigger point in the right lateral mid body trapezius with preserved right upper extremity strength and function   Assessment & Plan:   Douglas Benitez was seen today for neck pain.  Diagnoses and all orders for this visit:  Cervicalgia -     dexamethasone (DECADRON) injection 10 mg; 1 mL (10 mg total) by Other route once. -     ropivacaine (PF) 2 mg/ml (0.2%) (NAROPIN) epidural 10 mL; 10 mLs by Epidural route once. -     TRIGGER POINT INJECTION; Future  Myofacial muscle pain -     dexamethasone (DECADRON) injection 10 mg; 1 mL (10 mg total) by Other route once. -     ropivacaine (PF) 2 mg/ml (0.2%) (NAROPIN) epidural 10 mL; 10 mLs by Epidural route once.  DDD (degenerative disc disease),  cervical  Cervical radiculitis  Other orders -     ropivacaine (PF) 2 mg/ml (0.2%) (NAROPIN) 2 MG/ML epidural;  -     dexamethasone (DECADRON) 10 MG/ML injection;      ----------------------------------------------------------------------------------------------------------------------  Problem List Items Addressed This Visit    None    Visit Diagnoses    Cervicalgia    -  Primary   Relevant Medications   dexamethasone (DECADRON) injection 10 mg   ropivacaine (PF) 2 mg/ml (0.2%) (NAROPIN) epidural 10 mL   Other Relevant Orders   TRIGGER POINT INJECTION   Myofacial muscle pain       Relevant Medications   dexamethasone (DECADRON) injection 10 mg   ropivacaine (PF) 2 mg/ml (0.2%) (NAROPIN) epidural 10 mL   DDD (degenerative disc disease), cervical       Relevant Medications   dexamethasone (DECADRON) injection 10 mg   Cervical radiculitis          ----------------------------------------------------------------------------------------------------------------------  1. DDD (degenerative disc disease), lumbar 2. Cervicalgia As above and continue with stretching exercises and band exercises as described today. - TRIGGER POINT INJECTION - ropivacaine (PF) 2 mg/ml (0.2%) (NAROPIN) epidural 1 mL; 1 mL by Epidural route once. - dexamethasone (DECADRON) injection 10 mg;  1 mL (10 mg total) by Other route once.  3. Muscle pain, myofacial As above with return to clinic in 1-2 weeks for possible repeat injection. - TRIGGER POINT INJECTION    ----------------------------------------------------------------------------------------------------------------------  I am having Douglas. Benitez maintain his testosterone cypionate and UNABLE TO FIND. We administered ropivacaine (PF) 2 mg/mL (0.2%) and dexamethasone. We will continue to administer ropivacaine (PF) 2 mg/mL (0.2%), dexamethasone, dexamethasone, and ropivacaine (PF) 2 mg/mL (0.2%).   Meds ordered this encounter  Medications   . dexamethasone (DECADRON) injection 10 mg  . ropivacaine (PF) 2 mg/ml (0.2%) (NAROPIN) epidural 10 mL  . ropivacaine (PF) 2 mg/ml (0.2%) (NAROPIN) 2 MG/ML epidural    Sharlett Iles, Delores: cabinet override  . dexamethasone (DECADRON) 10 MG/ML injection    Sharlett Iles, Delores: cabinet override    Trigger point injection: The area overlying the aforementioned trigger points were prepped with alcohol. They were then injected with a 25-gauge needle with 4 cc of ropivacaine 0.2% and Decadron 4 mg at each site after negative aspiration for heme. This was performed after informed consent was obtained and risks and benefits reviewed. She tolerated this procedure without difficulty and was convalesced and discharged to home in stable condition for follow-up as mentioned.  @Douglas Benitez  Andree Elk, MD@   Follow-up: Return for procedure.    Molli Barrows, MD  This dictation was performed utilizing Dragon voice recognition software.  Please excuse any unintentional or mistaken typographical errors as a result of its unedited utilization.

## 2016-03-04 NOTE — Progress Notes (Signed)
Subjective:  Patient ID: Douglas Benitez, male    DOB: [birth year]  Age: 45 y.o. MRN: [id number]  CC: Shoulder Pain (suprascapular right)   Service Provided on Last Visit: Procedure (TPI right suprascap)  PROCEDURE:Right trapezius trigger point injection 2 #3  HPI Douglas Benitez presents for reevaluation. He was last seen approximately 1 month ago at which point he had his second trigger point injection and he's been doing well. Overall his pain is approximately 50-75% better less frequent and he sleeping better at night. The quality characteristic and distribution of the pain are otherwise unchanged. He continues to do his stretching exercises as previously reviewed with him. Otherwise his strength in the upper extremities is good.  History Douglas Benitez has a past medical history of Hypertension.   He has a past surgical history that includes Knee arthroscopy.   His family history includes Cancer in his father and mother; Hypertension in his mother.He reports that he has never smoked. He has never used smokeless tobacco. He reports that he does not drink alcohol or use drugs.  No results found for this or any previous visit.  No results found for: TOXASSSELUR   Lab Results  Component Value Date   CREATININE 1.10 11/08/2014    --------------------------------------------------------------------------------------------------------------------- Douglas Benitez W / Wo Cm  Result Date: 11/08/2014 CLINICAL DATA:  Benitez cancer, Gleason score 6 EXAM: Douglas Benitez WITHOUT AND WITH CONTRAST TECHNIQUE: Multiplanar multisequence MRI images were obtained of the pelvis centered about the Benitez. Pre and post contrast images were obtained. CONTRAST:  75mL MULTIHANCE GADOBENATE DIMEGLUMINE 529 MG/ML IV SOLN COMPARISON:  None. FINDINGS: Benitez: 7 mm focal T2 nodule in the medial right peripheral mid gland to apex (series 7/image 17). No definite restricted diffusion. 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 No findings specific for high-grade macroscopic Benitez cancer. Nodularity of the central gland which indents the base of the bladder, reflecting benign prostatic hyperplasia. No suspicious central gland nodule on T2. Transcapsular spread:  Absent. Seminal vesicle involvement: Absent. Neurovascular bundle involvement: Absent. Pelvic adenopathy: Absent. Bone metastasis: Absent. Other findings: Bladder is mildly thick-walled although underdistended. IMPRESSION: 7 mm nodule in the medial right peripheral gland apex, likely corresponding to the patient's low grade macroscopic Benitez cancer. No evidence of extracapsular extension, seminal vesicle invasion, or metastatic disease. Electronically Signed   By: Julian Hy M.D.   On: 11/08/2014 13:23       ---------------------------------------------------------------------------------------------------------------------- Past Medical History:  Diagnosis Date  . Hypertension     Past Surgical History:  Procedure Laterality Date  . KNEE ARTHROSCOPY      Family History  Problem Relation Age of Onset  . Hypertension Mother   . Cancer Mother   . Cancer Father     Social History  Substance Use Topics  . Smoking status: Never Smoker  . Smokeless tobacco: Never Used  . Alcohol use No    ---------------------------------------------------------------------------------------------------------------------- Social History   Social History  . Marital status: Married    Spouse name: N/A  . Number of children: N/A  . Years of education: N/A   Social History Main Topics  . Smoking status: Never Smoker  . Smokeless tobacco: Never Used  . Alcohol use No  . Drug use: No  . Sexual activity: Not Asked   Other Topics Concern  . None   Social History Narrative  . None    Scheduled Meds: . dexamethasone  10 mg Other Once  . dexamethasone  10 mg  Other Once  . dexamethasone  4 mg  Other Once  . ropivacaine (PF) 2 mg/mL (0.2%)  1 mL Epidural Once  . ropivacaine (PF) 2 mg/mL (0.2%)  10 mL Epidural Once  . ropivacaine (PF) 2 mg/mL (0.2%)  10 mL Epidural Once   Continuous Infusions: PRN Meds:.   BP 132/83   Pulse 80   Temp 97.9 F (36.6 C) (Oral)   Resp 18   Ht 6' 1.5" (1.867 m)   Wt 198 lb (89.8 kg)   SpO2 99%   BMI 25.77 kg/m    BP Readings from Last 3 Encounters:  03/03/16 132/83  01/30/16 131/78  01/07/16 124/81     Wt Readings from Last 3 Encounters:  03/03/16 198 lb (89.8 kg)  01/30/16 189 lb (85.7 kg)  01/07/16 190 lb (86.2 kg)     ----------------------------------------------------------------------------------------------------------------------  ROS Review of Systems  Cardiac: None Pulmonary: None Neurologic: Less frequent right upper extremity numbness and tingling and shoulder spasming  Objective:  BP 132/83   Pulse 80   Temp 97.9 F (36.6 C) (Oral)   Resp 18   Ht 6' 1.5" (1.867 m)   Wt 198 lb (89.8 kg)   SpO2 99%   BMI 25.77 kg/m   Physical Exam Patient is alert and oriented 3 cooperative compliant Heart is regular rate and rhythm Lungs are clear to also station Upper extremity strength appears to be at baseline with 5 over 5 strength both proximal and distal to the upper extremities. Hand grasps strength is intact and sensation appears to be grossly intact. Muscle tone and bulk to the upper extremities is good.He still has 1 trigger point in the right far lateral trapezius.  Assessment & Plan:   Douglas Benitez was seen today for shoulder pain.  Diagnoses and all orders for this visit:  Cervicalgia -     TRIGGER POINT INJECTION -     ropivacaine (PF) 2 mg/mL (0.2%) (NAROPIN) injection 10 mL; 10 mLs by Epidural route once. -     dexamethasone (DECADRON) injection 4 mg; 1 mL (4 mg total) by Other route once. -     TRIGGER POINT INJECTION; Future  Other orders -     ropivacaine (PF) 2 mg/mL (0.2%) (NAROPIN) 2 MG/ML  injection;  -     dexamethasone (DECADRON) 4 MG/ML injection;      ----------------------------------------------------------------------------------------------------------------------  Problem List Items Addressed This Visit    None    Visit Diagnoses    Cervicalgia       Relevant Medications   ropivacaine (PF) 2 mg/mL (0.2%) (NAROPIN) injection 10 mL   dexamethasone (DECADRON) injection 4 mg   Other Relevant Orders   TRIGGER POINT INJECTION      ----------------------------------------------------------------------------------------------------------------------  1. DDD (degenerative disc disease), lumbar We will defer on any repeat cervical epidurals at this time in favor of a trigger point injection to the right mid body trapezius. The risks and benefits have been reviewed with him in detail. Furthermore I have reviewed his cervical MRI with him today.  2. Cervicalgia AWe'll proceed with a repeat trigger point injection for the right lateral trapezius with return to clinic in 2 months and he is to continue his stretching strengthening exercises as discussed in detail today.  3. Muscle pain, myofacial As above with return to clinic in 1-2 weeks for possible repeat injection. - TRIGGER POINT INJECTION    ----------------------------------------------------------------------------------------------------------------------  I am having Douglas. Desta maintain his testosterone cypionate and UNABLE TO FIND. We will continue to  administer ropivacaine (PF) 2 mg/mL (0.2%), dexamethasone, dexamethasone, ropivacaine (PF) 2 mg/mL (0.2%), ropivacaine (PF) 2 mg/mL (0.2%), and dexamethasone.   Meds ordered this encounter  Medications  . ropivacaine (PF) 2 mg/mL (0.2%) (NAROPIN) 2 MG/ML injection    Willeen Cass L: cabinet override  . dexamethasone (DECADRON) 4 MG/ML injection    Willeen Cass L: cabinet override  . ropivacaine (PF) 2 mg/mL (0.2%) (NAROPIN) injection 10 mL  .  dexamethasone (DECADRON) injection 4 mg    Trigger point injection: The area overlying the aforementioned trigger points were prepped with alcohol. They were then injected with a 25-gauge needle with 4 cc of ropivacaine 0.2% and Decadron 4 mg at each site after negative aspiration for heme. This was performed after informed consent was obtained and risks and benefits reviewed. She tolerated this procedure without difficulty and was convalesced and discharged to home in stable condition for follow-up as mentioned.  @Electa Sterry  Andree Elk, MD@   Follow-up: Return for evaluation, procedure.    Molli Barrows, MD  This dictation was performed utilizing Dragon voice recognition software.  Please excuse any unintentional or mistaken typographical errors as a result of its unedited utilization.

## 2016-03-06 DIAGNOSIS — C61 Malignant neoplasm of prostate: Secondary | ICD-10-CM | POA: Diagnosis not present

## 2016-05-01 ENCOUNTER — Ambulatory Visit: Payer: 59 | Attending: Anesthesiology | Admitting: Anesthesiology

## 2016-05-01 ENCOUNTER — Encounter: Payer: Self-pay | Admitting: Anesthesiology

## 2016-05-01 VITALS — BP 124/78 | HR 78 | Temp 98.3°F | Resp 18 | Ht 73.0 in | Wt 194.0 lb

## 2016-05-01 DIAGNOSIS — I10 Essential (primary) hypertension: Secondary | ICD-10-CM | POA: Insufficient documentation

## 2016-05-01 DIAGNOSIS — M5136 Other intervertebral disc degeneration, lumbar region: Secondary | ICD-10-CM | POA: Diagnosis not present

## 2016-05-01 DIAGNOSIS — M5412 Radiculopathy, cervical region: Secondary | ICD-10-CM

## 2016-05-01 DIAGNOSIS — M5010 Cervical disc disorder with radiculopathy, unspecified cervical region: Secondary | ICD-10-CM | POA: Diagnosis not present

## 2016-05-01 DIAGNOSIS — M25511 Pain in right shoulder: Secondary | ICD-10-CM | POA: Diagnosis not present

## 2016-05-01 DIAGNOSIS — M542 Cervicalgia: Secondary | ICD-10-CM

## 2016-05-01 DIAGNOSIS — M791 Myalgia: Secondary | ICD-10-CM | POA: Insufficient documentation

## 2016-05-01 DIAGNOSIS — M5030 Other cervical disc degeneration, unspecified cervical region: Secondary | ICD-10-CM

## 2016-05-01 MED ORDER — DEXAMETHASONE SODIUM PHOSPHATE 4 MG/ML IJ SOLN
INTRAMUSCULAR | Status: AC
  Administered 2016-05-01: 4 mg
  Filled 2016-05-01: qty 3

## 2016-05-01 MED ORDER — ROPIVACAINE HCL 2 MG/ML IJ SOLN
INTRAMUSCULAR | Status: AC
  Administered 2016-05-01: 10 mL via EPIDURAL
  Filled 2016-05-01: qty 10

## 2016-05-01 MED ORDER — ROPIVACAINE HCL 2 MG/ML IJ SOLN
10.0000 mL | Freq: Once | INTRAMUSCULAR | Status: AC
  Administered 2016-05-01: 10 mL via EPIDURAL

## 2016-05-01 MED ORDER — DEXAMETHASONE SODIUM PHOSPHATE 10 MG/ML IJ SOLN
10.0000 mg | Freq: Once | INTRAMUSCULAR | Status: AC
  Filled 2016-05-01: qty 1

## 2016-05-01 NOTE — Progress Notes (Signed)
Subjective:  Patient ID: Douglas Benitez, male    DOB: [birth year]  Age: 46 y.o. MRN: [id number]  CC: Shoulder Pain (right side down the right arm)   Service Provided on Last Visit: Procedure  PROCEDURE:Right trapezius trigger point injection 1 #4  HPI Douglas Benitez presents for reevaluation. He was last seen in early December and had a trigger point injection at that point. He feels that he is making good progress in that the frequency and severity of his right trapezius pain is improved and he is having less radiculitis-like symptoms as reviewed today. Furthermore he has found an orthotic pillow which seems to be ineffective and is active with his exercise regimen for upper extremity strengthening and range of motion. He presently has some spasming in the right trapezius and would like to proceed with a repeat injection as previously reviewed on today's visit. History Douglas Benitez has a past medical history of Hypertension.   He has a past surgical history that includes Knee arthroscopy.   His family history includes Cancer in his father and mother; Hypertension in his mother.He reports that he has never smoked. He has never used smokeless tobacco. He reports that he does not drink alcohol or use drugs.  No results found for this or any previous visit.  No results found for: TOXASSSELUR   Lab Results  Component Value Date   CREATININE 1.10 11/08/2014    --------------------------------------------------------------------------------------------------------------------- Douglas Benitez  Result Date: 11/08/2014 CLINICAL DATA:  Prostate cancer, Gleason score 6 EXAM: Douglas PROSTATE WITHOUT AND WITH CONTRAST TECHNIQUE: Multiplanar multisequence MRI images were obtained of the pelvis centered about the prostate. Pre and post contrast images were obtained. CONTRAST:  54mL MULTIHANCE GADOBENATE DIMEGLUMINE 529 MG/ML IV SOLN COMPARISON:  None. FINDINGS: Prostate: 7 mm focal T2 nodule in  the medial right peripheral mid gland to apex (series 7/image 17). No definite restricted diffusion. 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 No findings specific for high-grade macroscopic prostate cancer. Nodularity of the central gland which indents the base of the bladder, reflecting benign prostatic hyperplasia. No suspicious central gland nodule on T2. Transcapsular spread:  Absent. Seminal vesicle involvement: Absent. Neurovascular bundle involvement: Absent. Pelvic adenopathy: Absent. Bone metastasis: Absent. Other findings: Bladder is mildly thick-walled although underdistended. IMPRESSION: 7 mm nodule in the medial right peripheral gland apex, likely corresponding to the patient's low grade macroscopic prostate cancer. No evidence of extracapsular extension, seminal vesicle invasion, or metastatic disease. Electronically Signed   By: Julian Hy M.D.   On: 11/08/2014 13:23       ---------------------------------------------------------------------------------------------------------------------- Past Medical History:  Diagnosis Date  . Hypertension     Past Surgical History:  Procedure Laterality Date  . KNEE ARTHROSCOPY      Family History  Problem Relation Age of Onset  . Hypertension Mother   . Cancer Mother   . Cancer Father     Social History  Substance Use Topics  . Smoking status: Never Smoker  . Smokeless tobacco: Never Used  . Alcohol use No    ---------------------------------------------------------------------------------------------------------------------- Social History   Social History  . Marital status: Married    Spouse name: N/A  . Number of children: N/A  . Years of education: N/A   Social History Main Topics  . Smoking status: Never Smoker  . Smokeless tobacco: Never Used  . Alcohol use No  . Drug use: No  . Sexual activity: Not Asked   Other Topics Concern  .  None   Social History  Narrative  . None    Scheduled Meds: . dexamethasone      . dexamethasone  10 mg Other Once  . dexamethasone  10 mg Other Once  . dexamethasone  10 mg Other Once  . dexamethasone  4 mg Other Once  . ropivacaine (PF) 2 mg/mL (0.2%)      . ropivacaine (PF) 2 mg/mL (0.2%)  1 mL Epidural Once  . ropivacaine (PF) 2 mg/mL (0.2%)  10 mL Epidural Once  . ropivacaine (PF) 2 mg/mL (0.2%)  10 mL Epidural Once  . ropivacaine (PF) 2 mg/mL (0.2%)  10 mL Epidural Once   Continuous Infusions: PRN Meds:.   BP 124/78   Pulse 78   Temp 98.3 F (36.8 C) (Oral)   Resp 18   Ht 6\' 1"  (1.854 m)   Wt 194 lb (88 kg)   SpO2 99%   BMI 25.60 kg/m    BP Readings from Last 3 Encounters:  05/01/16 124/78  03/03/16 132/83  01/30/16 131/78     Wt Readings from Last 3 Encounters:  05/01/16 194 lb (88 kg)  03/03/16 198 lb (89.8 kg)  01/30/16 189 lb (85.7 kg)     ----------------------------------------------------------------------------------------------------------------------  ROS Review of Systems  Cardiac: None Pulmonary: None Neurologic: Less frequent right upper extremity numbness and tingling and shoulder spasming  Objective:  BP 124/78   Pulse 78   Temp 98.3 F (36.8 C) (Oral)   Resp 18   Ht 6\' 1"  (1.854 m)   Wt 194 lb (88 kg)   SpO2 99%   BMI 25.60 kg/m   Physical Exam Patient is alert and oriented 3 cooperative compliant Heart is regular rate and rhythm Lungs are clear to also station Upper extremity strength appears to be at baseline with 5 over 5 strength both proximal and distal to the upper extremities. Hand grasps strength is intact and sensation appears to be grossly intact. Muscle tone and bulk to the upper extremities is good.He still has 1 trigger point in the right far lateral trapezius As previously documented. Otherwise his exam is unchanged.  Assessment & Plan:   Douglas Benitez was seen today for shoulder pain.  Diagnoses and all orders for this  visit:  Cervical radiculitis  Cervicalgia -     TRIGGER POINT INJECTION -     dexamethasone (DECADRON) injection 10 mg; 1 mL (10 mg total) by Other route once. -     ropivacaine (PF) 2 mg/mL (0.2%) (NAROPIN) injection 10 mL; 10 mLs by Epidural route once. -     TRIGGER POINT INJECTION; Future  DDD (degenerative disc disease), cervical  Other orders -     ropivacaine (PF) 2 mg/mL (0.2%) (NAROPIN) 2 MG/ML injection;  -     dexamethasone (DECADRON) 4 MG/ML injection;      ----------------------------------------------------------------------------------------------------------------------  Problem List Items Addressed This Visit    None    Visit Diagnoses    Cervical radiculitis    -  Primary   Cervicalgia       Relevant Medications   dexamethasone (DECADRON) injection 10 mg   ropivacaine (PF) 2 mg/mL (0.2%) (NAROPIN) injection 10 mL   Other Relevant Orders   TRIGGER POINT INJECTION   DDD (degenerative disc disease), cervical       Relevant Medications   dexamethasone (DECADRON) injection 10 mg   dexamethasone (DECADRON) 4 MG/ML injection      ----------------------------------------------------------------------------------------------------------------------  1. DDD (degenerative disc disease), lumbar Continue with upper extremity stretching  strengthening exercises and baseline preventive therapy as reviewed in detail today 2. Cervicalgia We'll proceed with a repeat trigger point injection today. We have gone over some more exercises to manage full range of motion and muscle tone and bulk in the upper extremities  3. Muscle pain, myofacial As above with return to clinic in 2 months for repeat injection - TRIGGER POINT INJECTION    ----------------------------------------------------------------------------------------------------------------------  I am having Douglas. Benitez maintain his testosterone cypionate and UNABLE TO FIND. We will continue to administer  ropivacaine (PF) 2 mg/mL (0.2%), dexamethasone, dexamethasone, ropivacaine (PF) 2 mg/mL (0.2%), ropivacaine (PF) 2 mg/mL (0.2%), dexamethasone, dexamethasone, and ropivacaine (PF) 2 mg/mL (0.2%).   Meds ordered this encounter  Medications  . dexamethasone (DECADRON) injection 10 mg  . ropivacaine (PF) 2 mg/mL (0.2%) (NAROPIN) injection 10 mL  . ropivacaine (PF) 2 mg/mL (0.2%) (NAROPIN) 2 MG/ML injection    Willeen Cass L: cabinet override  . dexamethasone (DECADRON) 4 MG/ML injection    Willeen Cass L: cabinet override    Trigger point injection: The area overlying the aforementioned trigger point was prepped with alcohol. It was then injected with a 25-gauge needle with  cc of ropivacaine 0.2% and Decadron 8 mg at each site after negative aspiration for heme. This was performed after informed consent was obtained and risks and benefits reviewed. She tolerated this procedure without difficulty and was convalesced and discharged to home in stable condition for follow-up as mentioned.  @Kameron Glazebrook  Andree Elk, MD@   Follow-up: Return in about 2 months (around 06/29/2016) for evaluation, procedure.    Molli Barrows, MD  This dictation was performed utilizing Dragon voice recognition software.  Please excuse any unintentional or mistaken typographical errors as a result of its unedited utilization.

## 2016-05-02 ENCOUNTER — Telehealth: Payer: Self-pay

## 2016-05-02 NOTE — Telephone Encounter (Signed)
Post procedure phone call.  Unable to leave a message.  No voicemail.

## 2016-05-08 ENCOUNTER — Telehealth: Payer: Self-pay | Admitting: *Deleted

## 2016-05-21 DIAGNOSIS — C61 Malignant neoplasm of prostate: Secondary | ICD-10-CM | POA: Diagnosis not present

## 2016-05-21 DIAGNOSIS — N5201 Erectile dysfunction due to arterial insufficiency: Secondary | ICD-10-CM | POA: Diagnosis not present

## 2016-06-11 ENCOUNTER — Telehealth: Payer: Self-pay | Admitting: *Deleted

## 2016-08-27 DIAGNOSIS — C61 Malignant neoplasm of prostate: Secondary | ICD-10-CM | POA: Diagnosis not present

## 2016-08-27 DIAGNOSIS — Z6825 Body mass index (BMI) 25.0-25.9, adult: Secondary | ICD-10-CM | POA: Diagnosis not present

## 2016-08-27 DIAGNOSIS — I10 Essential (primary) hypertension: Secondary | ICD-10-CM | POA: Diagnosis not present

## 2016-12-24 DIAGNOSIS — C61 Malignant neoplasm of prostate: Secondary | ICD-10-CM | POA: Diagnosis not present

## 2017-03-16 DIAGNOSIS — N5201 Erectile dysfunction due to arterial insufficiency: Secondary | ICD-10-CM | POA: Diagnosis not present

## 2017-03-16 DIAGNOSIS — C61 Malignant neoplasm of prostate: Secondary | ICD-10-CM | POA: Diagnosis not present

## 2017-05-16 DIAGNOSIS — Z79899 Other long term (current) drug therapy: Secondary | ICD-10-CM | POA: Diagnosis not present

## 2017-05-16 DIAGNOSIS — E236 Other disorders of pituitary gland: Secondary | ICD-10-CM | POA: Diagnosis not present

## 2017-05-16 DIAGNOSIS — I10 Essential (primary) hypertension: Secondary | ICD-10-CM | POA: Diagnosis not present

## 2017-05-22 DIAGNOSIS — C61 Malignant neoplasm of prostate: Secondary | ICD-10-CM | POA: Diagnosis not present

## 2017-05-22 DIAGNOSIS — Z6825 Body mass index (BMI) 25.0-25.9, adult: Secondary | ICD-10-CM | POA: Diagnosis not present

## 2017-05-22 DIAGNOSIS — I10 Essential (primary) hypertension: Secondary | ICD-10-CM | POA: Diagnosis not present

## 2017-05-22 DIAGNOSIS — Z0001 Encounter for general adult medical examination with abnormal findings: Secondary | ICD-10-CM | POA: Diagnosis not present

## 2017-05-26 ENCOUNTER — Telehealth: Payer: Self-pay

## 2017-05-26 ENCOUNTER — Other Ambulatory Visit: Payer: Self-pay

## 2017-05-26 DIAGNOSIS — Z1211 Encounter for screening for malignant neoplasm of colon: Secondary | ICD-10-CM

## 2017-05-26 NOTE — Telephone Encounter (Signed)
Gastroenterology Pre-Procedure Review  Request Date:  Requesting Physician: Dr.   PATIENT REVIEW QUESTIONS: The patient responded to the following health history questions as indicated:    1. Are you having any GI issues? no 2. Do you have a personal history of Polyps? no 3. Do you have a family history of Colon Cancer or Polyps? no 4. Diabetes Mellitus? no 5. Joint replacements in the past 12 months?no 6. Major health problems in the past 3 months?no 7. Any artificial heart valves, MVP, or defibrillator?no    MEDICATIONS & ALLERGIES:    Patient reports the following regarding taking any anticoagulation/antiplatelet therapy:   Plavix, Coumadin, Eliquis, Xarelto, Lovenox, Pradaxa, Brilinta, or Effient? no Aspirin? no  Patient confirms/reports the following medications:    Patient confirms/reports the following allergies:  No Known Allergies  No orders of the defined types were placed in this encounter.   AUTHORIZATION INFORMATION Primary Insurance: 1D#: Group #:  Secondary Insurance: 1D#: Group #:  SCHEDULE INFORMATION: Date: 07/24/17 Time: Location: [facility]

## 2017-07-20 ENCOUNTER — Other Ambulatory Visit: Payer: Self-pay

## 2017-07-20 DIAGNOSIS — Z1211 Encounter for screening for malignant neoplasm of colon: Secondary | ICD-10-CM

## 2017-07-21 NOTE — Discharge Instructions (Signed)
General Anesthesia, Adult, Care After °These instructions provide you with information about caring for yourself after your procedure. Your health care provider may also give you more specific instructions. Your treatment has been planned according to current medical practices, but problems sometimes occur. Call your health care provider if you have any problems or questions after your procedure. °What can I expect after the procedure? °After the procedure, it is common to have: °· Vomiting. °· A sore throat. °· Mental slowness. ° °It is common to feel: °· Nauseous. °· Cold or shivery. °· Sleepy. °· Tired. °· Sore or achy, even in parts of your body where you did not have surgery. ° °Follow these instructions at home: °For at least 24 hours after the procedure: °· Do not: °? Participate in activities where you could fall or become injured. °? Drive. °? Use heavy machinery. °? Drink alcohol. °? Take sleeping pills or medicines that cause drowsiness. °? Make important decisions or sign legal documents. °? Take care of children on your own. °· Rest. °Eating and drinking °· If you vomit, drink water, juice, or soup when you can drink without vomiting. °· Drink enough fluid to keep your urine clear or pale yellow. °· Make sure you have little or no nausea before eating solid foods. °· Follow the diet recommended by your health care provider. °General instructions °· Have a responsible adult stay with you until you are awake and alert. °· Return to your normal activities as told by your health care provider. Ask your health care provider what activities are safe for you. °· Take over-the-counter and prescription medicines only as told by your health care provider. °· If you smoke, do not smoke without supervision. °· Keep all follow-up visits as told by your health care provider. This is important. °Contact a health care provider if: °· You continue to have nausea or vomiting at home, and medicines are not helpful. °· You  cannot drink fluids or start eating again. °· You cannot urinate after 8-12 hours. °· You develop a skin rash. °· You have fever. °· You have increasing redness at the site of your procedure. °Get help right away if: °· You have difficulty breathing. °· You have chest pain. °· You have unexpected bleeding. °· You feel that you are having a life-threatening or urgent problem. °This information is not intended to replace advice given to you by your health care provider. Make sure you discuss any questions you have with your health care provider. °Document Released: 06/23/2000 Document Revised: 08/20/2015 Document Reviewed: 03/01/2015 °Elsevier Interactive Patient Education © 2018 [employer] ° °

## 2017-07-24 ENCOUNTER — Ambulatory Visit: Payer: 59 | Admitting: Anesthesiology

## 2017-07-24 ENCOUNTER — Ambulatory Visit
Admission: RE | Admit: 2017-07-24 | Discharge: 2017-07-24 | Disposition: A | Discharge status: Home / Self Care | Payer: 59 | Source: Ambulatory Visit | Attending: Gastroenterology | Admitting: Gastroenterology

## 2017-07-24 ENCOUNTER — Encounter
Admission: RE | Disposition: A | Discharge status: Home / Self Care | Payer: Self-pay | Source: Ambulatory Visit | Attending: Gastroenterology

## 2017-07-24 DIAGNOSIS — K640 First degree hemorrhoids: Secondary | ICD-10-CM | POA: Diagnosis not present

## 2017-07-24 DIAGNOSIS — Z79899 Other long term (current) drug therapy: Secondary | ICD-10-CM | POA: Insufficient documentation

## 2017-07-24 DIAGNOSIS — Z1211 Encounter for screening for malignant neoplasm of colon: Secondary | ICD-10-CM | POA: Diagnosis not present

## 2017-07-24 DIAGNOSIS — I10 Essential (primary) hypertension: Secondary | ICD-10-CM | POA: Insufficient documentation

## 2017-07-24 DIAGNOSIS — K5730 Diverticulosis of large intestine without perforation or abscess without bleeding: Secondary | ICD-10-CM | POA: Diagnosis not present

## 2017-07-24 DIAGNOSIS — C61 Malignant neoplasm of prostate: Secondary | ICD-10-CM

## 2017-07-24 HISTORY — PX: COLONOSCOPY WITH PROPOFOL: SHX5780

## 2017-07-24 SURGERY — COLONOSCOPY WITH PROPOFOL
Anesthesia: General | Wound class: Contaminated

## 2017-07-24 MED ORDER — OXYCODONE HCL 5 MG PO TABS: 5.0000 mg | ORAL_TABLET | Freq: Once | ORAL | Status: DC | PRN

## 2017-07-24 MED ORDER — LIDOCAINE HCL (CARDIAC) PF 100 MG/5ML IV SOSY
PREFILLED_SYRINGE | INTRAVENOUS | Status: DC | PRN
  Administered 2017-07-24: 30 mg via INTRAVENOUS

## 2017-07-24 MED ORDER — OXYCODONE HCL 5 MG/5ML PO SOLN: 5.0000 mg | Freq: Once | ORAL | Status: DC | PRN

## 2017-07-24 MED ORDER — PROPOFOL 10 MG/ML IV BOLUS
INTRAVENOUS | Status: DC | PRN
  Administered 2017-07-24: 50 mg via INTRAVENOUS
  Administered 2017-07-24: 150 mg via INTRAVENOUS
  Administered 2017-07-24: 30 mg via INTRAVENOUS
  Administered 2017-07-24: 20 mg via INTRAVENOUS
  Administered 2017-07-24: 50 mg via INTRAVENOUS

## 2017-07-24 MED ORDER — MEPERIDINE HCL 25 MG/ML IJ SOLN: 6.2500 mg | INTRAMUSCULAR | Status: DC | PRN

## 2017-07-24 MED ORDER — PROMETHAZINE HCL 25 MG/ML IJ SOLN: 6.2500 mg | INTRAMUSCULAR | Status: DC | PRN

## 2017-07-24 MED ORDER — FENTANYL CITRATE (PF) 100 MCG/2ML IJ SOLN: 25.0000 ug | INTRAMUSCULAR | Status: DC | PRN

## 2017-07-24 MED ORDER — LACTATED RINGERS IV SOLN
10.0000 mL/h | INTRAVENOUS | Status: DC
  Administered 2017-07-24: 10 mL/h via INTRAVENOUS

## 2017-07-24 MED ORDER — STERILE WATER FOR IRRIGATION IR SOLN
Status: DC | PRN
  Administered 2017-07-24: 10:00:00

## 2017-07-24 SURGICAL SUPPLY — 24 items
CANISTER SUCT 1200ML W/VALVE (MISCELLANEOUS) ×2 IMPLANT
CLIP HMST 235XBRD CATH ROT (MISCELLANEOUS) IMPLANT
CLIP RESOLUTION 360 11X235 (MISCELLANEOUS)
ELECT REM PT RETURN 9FT ADLT (ELECTROSURGICAL)
ELECTRODE REM PT RTRN 9FT ADLT (ELECTROSURGICAL) IMPLANT
FCP ESCP3.2XJMB 240X2.8X (MISCELLANEOUS)
FORCEPS BIOP RAD 4 LRG CAP 4 (CUTTING FORCEPS) IMPLANT
FORCEPS BIOP RJ4 240 W/NDL (MISCELLANEOUS)
FORCEPS ESCP3.2XJMB 240X2.8X (MISCELLANEOUS) IMPLANT
GOWN CVR UNV OPN BCK APRN NK (MISCELLANEOUS) ×2 IMPLANT
GOWN ISOL THUMB LOOP REG UNIV (MISCELLANEOUS) ×2
INJECTOR VARIJECT VIN23 (MISCELLANEOUS) IMPLANT
KIT DEFENDO VALVE AND CONN (KITS) IMPLANT
KIT ENDO PROCEDURE OLY (KITS) ×2 IMPLANT
MARKER SPOT ENDO TATTOO 5ML (MISCELLANEOUS) IMPLANT
PROBE APC STR FIRE (PROBE) IMPLANT
RETRIEVER NET ROTH 2.5X230 LF (MISCELLANEOUS) IMPLANT
SNARE SHORT THROW 13M SML OVAL (MISCELLANEOUS) IMPLANT
SNARE SHORT THROW 30M LRG OVAL (MISCELLANEOUS) IMPLANT
SNARE SNG USE RND 15MM (INSTRUMENTS) IMPLANT
SPOT EX ENDOSCOPIC TATTOO (MISCELLANEOUS)
TRAP ETRAP POLY (MISCELLANEOUS) IMPLANT
VARIJECT INJECTOR VIN23 (MISCELLANEOUS)
WATER STERILE IRR 250ML POUR (IV SOLUTION) ×2 IMPLANT

## 2017-07-24 NOTE — Anesthesia Postprocedure Evaluation (Signed)
Anesthesia Post Note  Patient: Douglas Benitez  Procedure(s) Performed: COLONOSCOPY WITH PROPOFOL (N/A )  Patient location during evaluation: PACU Anesthesia Type: General Level of consciousness: awake and alert Pain management: pain level controlled Vital Signs Assessment: post-procedure vital signs reviewed and stable Respiratory status: spontaneous breathing, nonlabored ventilation, respiratory function stable and patient connected to nasal cannula oxygen Cardiovascular status: blood pressure returned to baseline and stable Postop Assessment: no apparent nausea or vomiting Anesthetic complications: no    SCOURAS, NICOLE ELAINE

## 2017-07-24 NOTE — Anesthesia Procedure Notes (Signed)
Date/Time: 07/24/2017 9:57 AM Performed by: Cameron Ali, CRNA Pre-anesthesia Checklist: Patient identified, Emergency Drugs available, Suction available, Timeout performed and Patient being monitored Patient Re-evaluated:Patient Re-evaluated prior to induction Oxygen Delivery Method: Nasal cannula Placement Confirmation: positive ETCO2

## 2017-07-24 NOTE — Transfer of Care (Signed)
Immediate Anesthesia Transfer of Care Note  Patient: Douglas Benitez  Procedure(s) Performed: COLONOSCOPY WITH PROPOFOL (N/A )  Patient Location: PACU  Anesthesia Type: General  Level of Consciousness: awake, alert  and patient cooperative  Airway and Oxygen Therapy: Patient Spontanous Breathing and Patient connected to supplemental oxygen  Post-op Assessment: Post-op Vital signs reviewed, Patient's Cardiovascular Status Stable, Respiratory Function Stable, Patent Airway and No signs of Nausea or vomiting  Post-op Vital Signs: Reviewed and stable  Complications: No apparent anesthesia complications

## 2017-07-24 NOTE — H&P (Signed)
Douglas Lame, MD [facility] [address], [place], [place] [address] Phone: [phone] Fax : [phone]  Primary Care Physician:  Douglas Noble, MD Primary Gastroenterologist:  Dr. Allen Benitez  Pre-Procedure History & Physical: HPI:  Douglas Benitez is a 47 y.o. male is here for a screening colonoscopy.   Past Medical History:  Diagnosis Date  . Hypertension     Past Surgical History:  Procedure Laterality Date  . KNEE ARTHROSCOPY      Prior to Admission medications   Medication Sig Start Date End Date Taking? Authorizing Provider  valsartan-hydrochlorothiazide (DIOVAN-HCT) 160-12.5 MG tablet Take 1 tablet by mouth daily.   Yes [provider]  testosterone cypionate (DEPOTESTOTERONE CYPIONATE) 200 MG/ML injection Inject into the muscle every 14 (fourteen) days.      [provider]    Allergies as of 07/20/2017  . (No Known Allergies)    Family History  Problem Relation Age of Onset  . Hypertension Mother   . Cancer Mother   . Cancer Father     Social History   Socioeconomic History  . Marital status: Married    Spouse name: Not on file  . Number of children: Not on file  . Years of education: Not on file  . Highest education level: Not on file  Occupational History  . Not on file  Social Needs  . Financial resource strain: Not on file  . Food insecurity:    Worry: Not on file    Inability: Not on file  . Transportation needs:    Medical: Not on file    Non-medical: Not on file  Tobacco Use  . Smoking status: Never Smoker  . Smokeless tobacco: Never Used  Substance and Sexual Activity  . Alcohol use: No  . Drug use: No  . Sexual activity: Not on file  Lifestyle  . Physical activity:    Days per week: Not on file    Minutes per session: Not on file  . Stress: Not on file  Relationships  . Social connections:    Talks on phone: Not on file    Gets together: Not on file    Attends religious service: Not on file    Active member  of club or organization: Not on file    Attends meetings of clubs or organizations: Not on file    Relationship status: Not on file  . Intimate partner violence:    Fear of current or ex partner: Not on file    Emotionally abused: Not on file    Physically abused: Not on file    Forced sexual activity: Not on file  Other Topics Concern  . Not on file  Social History Narrative  . Not on file    Review of Systems: See HPI, otherwise negative ROS  Physical Exam: BP (!) 145/95   Pulse 64   Temp 97.9 F (36.6 C) (Temporal)   Resp 16   Ht 6\' 1"  (1.854 m)   Wt 190 lb (86.2 kg)   SpO2 100%   BMI 25.07 kg/m  General:   Alert,  pleasant and cooperative in NAD Head:  Normocephalic and atraumatic. Neck:  Supple; no masses or thyromegaly. Lungs:  Clear throughout to auscultation.    Heart:  Regular rate and rhythm. Abdomen:  Soft, nontender and nondistended. Normal bowel sounds, without guarding, and without rebound.   Neurologic:  Alert and  oriented x4;  grossly normal neurologically.  Impression/Plan: STARR ENGEL is now here  to undergo a screening colonoscopy.  Risks, benefits, and alternatives regarding colonoscopy have been reviewed with the patient.  Questions have been answered.  All parties agreeable.

## 2017-07-24 NOTE — Op Note (Signed)
[facility] Patient Name: Douglas Benitez Procedure Date: 07/24/2017 9:50 AM MRN: [id number] Account #: [id number] Date of Birth: 05-11-1970 Admit Type: Outpatient Age: 47 Room: [facility] OR ROOM 01 Gender: Male Note Status: Finalized Procedure:            Colonoscopy Indications:          Screening for colorectal malignant neoplasm Providers:            [name] MD, MD Referring MD:         Asencion Noble (Referring MD) Medicines:            Propofol per Anesthesia Complications:        No immediate complications. Procedure:            Pre-Anesthesia Assessment:                       - Prior to the procedure, a History and Physical was                        performed, and patient medications and allergies were                        reviewed. The patient's tolerance of previous                        anesthesia was also reviewed. The risks and benefits of                        the procedure and the sedation options and risks were                        discussed with the patient. All questions were                        answered, and informed consent was obtained. Prior                        Anticoagulants: The patient has taken no previous                        anticoagulant or antiplatelet agents. ASA Grade                        Assessment: II - A patient with mild systemic disease.                        After reviewing the risks and benefits, the patient was                        deemed in satisfactory condition to undergo the                        procedure.                       After obtaining informed consent, the colonoscope was                        passed under direct vision. Throughout the procedure,  the patient's blood pressure, pulse, and oxygen                        saturations were monitored continuously. The Olympus                        CF-HQ190L Colonoscope (S#. [phone]) was introduced                         through the anus and advanced to the the cecum,                        identified by appendiceal orifice and ileocecal valve.                        The colonoscopy was performed without difficulty. The                        patient tolerated the procedure well. The quality of                        the bowel preparation was excellent. Findings:      The perianal and digital rectal examinations were normal.      Multiple small-mouthed diverticula were found in the entire colon.      Non-bleeding internal hemorrhoids were found during retroflexion. The       hemorrhoids were Grade I (internal hemorrhoids that do not prolapse). Impression:           - Diverticulosis in the entire examined colon.                       - Non-bleeding internal hemorrhoids.                       - No specimens collected. Recommendation:       - Discharge patient to home.                       - Resume previous diet.                       - Continue present medications.                       - Repeat colonoscopy in 10 years for screening unless                        any change in family history or lower GI problems. Procedure Code(s):    --- Professional ---                       [id number], Colonoscopy, flexible; diagnostic, including                        collection of specimen(s) by brushing or washing, when                        performed (separate procedure) Diagnosis Code(s):    --- Professional ---                       Z12.11, Encounter for screening for malignant neoplasm  of colon CPT copyright 2017 American Medical Association. All rights reserved. The codes documented in this report are preliminary and upon coder review may  be revised to meet current compliance requirements. [name] MD, MD 07/24/2017 10:13:27 AM This report has been signed electronically. Number of Addenda: 0 Note Initiated On: 07/24/2017 9:50 AM Scope Withdrawal Time: 0 hours 7 minutes 26 seconds   Total Procedure Duration: 0 hours 10 minutes 38 seconds       [facility]

## 2017-07-24 NOTE — Anesthesia Preprocedure Evaluation (Signed)
Anesthesia Evaluation  Patient identified by MRN, date of birth, ID band Patient awake    Reviewed: Allergy & Precautions, H&P , NPO status , Patient's Chart, lab work & pertinent test results, reviewed documented beta blocker date and time   Airway Mallampati: II  TM Distance: >3 FB Neck ROM: full    Dental  (+) Caps   Pulmonary neg pulmonary ROS,    Pulmonary exam normal breath sounds clear to auscultation       Cardiovascular Exercise Tolerance: Good hypertension,  Rhythm:regular Rate:Normal     Neuro/Psych negative neurological ROS  negative psych ROS   GI/Hepatic negative GI ROS, Neg liver ROS,   Endo/Other  negative endocrine ROS  Renal/GU negative Renal ROS  negative genitourinary   Musculoskeletal   Abdominal   Peds  Hematology negative hematology ROS (+)   Anesthesia Other Findings   Reproductive/Obstetrics negative OB ROS                             Anesthesia Physical Anesthesia Plan  ASA: II  Anesthesia Plan: General   Post-op Pain Management:    Induction:   PONV Risk Score and Plan:   Airway Management Planned:   Additional Equipment:   Intra-op Plan:   Post-operative Plan:   Informed Consent: I have reviewed the patients History and Physical, chart, labs and discussed the procedure including the risks, benefits and alternatives for the proposed anesthesia with the patient or authorized representative who has indicated his/her understanding and acceptance.   Dental Advisory Given  Plan Discussed with: CRNA  Anesthesia Plan Comments:         Anesthesia Quick Evaluation

## 2017-07-27 ENCOUNTER — Encounter: Payer: Self-pay | Admitting: Gastroenterology

## 2017-10-05 ENCOUNTER — Other Ambulatory Visit: Payer: Self-pay | Admitting: Urology

## 2017-10-05 DIAGNOSIS — C61 Malignant neoplasm of prostate: Secondary | ICD-10-CM

## 2017-10-21 ENCOUNTER — Inpatient Hospital Stay
Admission: RE | Admit: 2017-10-21 | Discharge: 2017-10-21 | Disposition: A | Discharge status: Home / Self Care | Payer: 59 | Source: Ambulatory Visit | Attending: Urology | Admitting: Urology

## 2017-11-25 DIAGNOSIS — B354 Tinea corporis: Secondary | ICD-10-CM | POA: Diagnosis not present

## 2017-11-25 DIAGNOSIS — I10 Essential (primary) hypertension: Secondary | ICD-10-CM | POA: Diagnosis not present

## 2018-04-21 ENCOUNTER — Ambulatory Visit
Admission: RE | Admit: 2018-04-21 | Discharge: 2018-04-21 | Disposition: A | Discharge status: Home / Self Care | Payer: 59 | Source: Ambulatory Visit | Attending: Urology | Admitting: Urology

## 2018-04-21 DIAGNOSIS — R972 Elevated prostate specific antigen [PSA]: Secondary | ICD-10-CM | POA: Diagnosis not present

## 2018-04-21 DIAGNOSIS — C61 Malignant neoplasm of prostate: Secondary | ICD-10-CM

## 2018-04-21 MED ORDER — GADOBENATE DIMEGLUMINE 529 MG/ML IV SOLN
17.0000 mL | Freq: Once | INTRAVENOUS | Status: AC | PRN
  Administered 2018-04-21: 17 mL via INTRAVENOUS

## 2018-05-03 DIAGNOSIS — C61 Malignant neoplasm of prostate: Secondary | ICD-10-CM | POA: Diagnosis not present

## 2018-06-08 ENCOUNTER — Other Ambulatory Visit
Admission: RE | Admit: 2018-06-08 | Discharge: 2018-06-08 | Disposition: A | Discharge status: Home / Self Care | Payer: 59 | Attending: Internal Medicine | Admitting: Internal Medicine

## 2018-06-08 DIAGNOSIS — I10 Essential (primary) hypertension: Secondary | ICD-10-CM | POA: Diagnosis not present

## 2018-06-08 DIAGNOSIS — Z79899 Other long term (current) drug therapy: Secondary | ICD-10-CM | POA: Insufficient documentation

## 2018-06-08 DIAGNOSIS — C61 Malignant neoplasm of prostate: Secondary | ICD-10-CM | POA: Diagnosis not present

## 2018-06-08 LAB — COMPREHENSIVE METABOLIC PANEL
ALBUMIN: 4.6 g/dL (ref 3.5–5.0)
ALK PHOS: 52 U/L (ref 38–126)
ALT: 37 U/L (ref 0–44)
ANION GAP: 7 (ref 5–15)
AST: 29 U/L (ref 15–41)
BUN: 18 mg/dL (ref 6–20)
CALCIUM: 9.2 mg/dL (ref 8.9–10.3)
CHLORIDE: 105 mmol/L (ref 98–111)
CO2: 30 mmol/L (ref 22–32)
Creatinine, Ser: 0.99 mg/dL (ref 0.61–1.24)
GFR calc non Af Amer: >60 mL/min (ref >60)
GLUCOSE: 101 mg/dL — AB (ref 70–99)
POTASSIUM: 3.6 mmol/L (ref 3.5–5.1)
SODIUM: 142 mmol/L (ref 135–145)
Total Bilirubin: 0.6 mg/dL (ref 0.3–1.2)
Total Protein: 8.1 g/dL (ref 6.5–8.1)

## 2018-06-08 LAB — LIPID PANEL
CHOL/HDL RATIO: 4.2 ratio
Cholesterol: 187 mg/dL (ref 0–200)
HDL: 45 mg/dL (ref >40)
LDL CALC: 124 mg/dL — AB (ref 0–99)
TRIGLYCERIDES: 90 mg/dL (ref <150)
VLDL: 18 mg/dL (ref 0–40)

## 2018-06-08 LAB — CBC
HCT: 45.3 % (ref 39.0–52.0)
Hemoglobin: 13.7 g/dL (ref 13.0–17.0)
MCH: 26.7 pg (ref 26.0–34.0)
MCHC: 30.2 g/dL (ref 30.0–36.0)
MCV: 88.1 fL (ref 80.0–100.0)
PLATELETS: 254 10*3/uL (ref 150–400)
RBC: 5.14 MIL/uL (ref 4.22–5.81)
RDW: 13 % (ref 11.5–15.5)
WBC: 5.6 10*3/uL (ref 4.0–10.5)
nRBC: 0 % (ref 0.0–0.2)

## 2018-06-08 LAB — URINALYSIS, COMPLETE (UACMP) WITH MICROSCOPIC
Bacteria, UA: NONE SEEN
Bilirubin Urine: NEGATIVE
Glucose, UA: NEGATIVE mg/dL
Hgb urine dipstick: NEGATIVE
KETONES UR: NEGATIVE mg/dL
Leukocytes,Ua: NEGATIVE
Nitrite: NEGATIVE
PH: 5 (ref 5.0–8.0)
PROTEIN: NEGATIVE mg/dL
Specific Gravity, Urine: 1.023 (ref 1.005–1.030)

## 2018-06-14 DIAGNOSIS — Z6825 Body mass index (BMI) 25.0-25.9, adult: Secondary | ICD-10-CM | POA: Diagnosis not present

## 2018-06-14 DIAGNOSIS — Z0001 Encounter for general adult medical examination with abnormal findings: Secondary | ICD-10-CM | POA: Diagnosis not present

## 2018-06-14 DIAGNOSIS — I10 Essential (primary) hypertension: Secondary | ICD-10-CM | POA: Diagnosis not present

## 2018-06-14 DIAGNOSIS — C61 Malignant neoplasm of prostate: Secondary | ICD-10-CM | POA: Diagnosis not present

## 2018-06-14 DIAGNOSIS — N529 Male erectile dysfunction, unspecified: Secondary | ICD-10-CM | POA: Diagnosis not present

## 2018-12-13 DIAGNOSIS — I10 Essential (primary) hypertension: Secondary | ICD-10-CM | POA: Diagnosis not present

## 2018-12-13 DIAGNOSIS — C61 Malignant neoplasm of prostate: Secondary | ICD-10-CM | POA: Diagnosis not present

## 2018-12-13 DIAGNOSIS — Z23 Encounter for immunization: Secondary | ICD-10-CM | POA: Diagnosis not present

## 2019-01-26 DIAGNOSIS — C61 Malignant neoplasm of prostate: Secondary | ICD-10-CM | POA: Diagnosis not present

## 2019-03-17 MED ORDER — CYCLOBENZAPRINE HCL 10 MG PO TABS: 10.0000 mg | ORAL_TABLET | Freq: Every day | ORAL | 2 refills | Status: AC

## 2019-03-17 NOTE — Addendum Note (Signed)
Addended by: Molli Barrows on: 03/17/2019 07:43 AM   Modules accepted: Orders

## 2019-06-13 ENCOUNTER — Other Ambulatory Visit
Admission: RE | Admit: 2019-06-13 | Discharge: 2019-06-13 | Disposition: A | Discharge status: Home / Self Care | Payer: 59 | Source: Ambulatory Visit | Attending: Urology | Admitting: Urology

## 2019-06-13 DIAGNOSIS — C61 Malignant neoplasm of prostate: Secondary | ICD-10-CM | POA: Diagnosis not present

## 2019-06-13 LAB — PSA: Prostatic Specific Antigen: 4.17 ng/mL — ABNORMAL HIGH (ref 0.00–4.00)

## 2019-07-04 ENCOUNTER — Other Ambulatory Visit
Admission: RE | Admit: 2019-07-04 | Discharge: 2019-07-04 | Disposition: A | Discharge status: Home / Self Care | Payer: 59 | Source: Ambulatory Visit | Attending: Internal Medicine | Admitting: Internal Medicine

## 2019-07-04 DIAGNOSIS — E236 Other disorders of pituitary gland: Secondary | ICD-10-CM | POA: Diagnosis not present

## 2019-07-04 DIAGNOSIS — I10 Essential (primary) hypertension: Secondary | ICD-10-CM | POA: Insufficient documentation

## 2019-07-04 DIAGNOSIS — Z79899 Other long term (current) drug therapy: Secondary | ICD-10-CM | POA: Insufficient documentation

## 2019-07-04 DIAGNOSIS — C61 Malignant neoplasm of prostate: Secondary | ICD-10-CM | POA: Insufficient documentation

## 2019-07-04 DIAGNOSIS — M2500 Hemarthrosis, unspecified joint: Secondary | ICD-10-CM | POA: Insufficient documentation

## 2019-07-04 LAB — COMPREHENSIVE METABOLIC PANEL
ALT: 26 U/L (ref 0–44)
AST: 23 U/L (ref 15–41)
Albumin: 4.4 g/dL (ref 3.5–5.0)
Alkaline Phosphatase: 51 U/L (ref 38–126)
Anion gap: 8 (ref 5–15)
BUN: 16 mg/dL (ref 6–20)
CO2: 25 mmol/L (ref 22–32)
Calcium: 9.1 mg/dL (ref 8.9–10.3)
Chloride: 108 mmol/L (ref 98–111)
Creatinine, Ser: 0.96 mg/dL (ref 0.61–1.24)
GFR calc Af Amer: >60 mL/min (ref >60)
GFR calc non Af Amer: >60 mL/min (ref >60)
Glucose, Bld: 105 mg/dL — ABNORMAL HIGH (ref 70–99)
Potassium: 3.6 mmol/L (ref 3.5–5.1)
Sodium: 141 mmol/L (ref 135–145)
Total Bilirubin: 0.6 mg/dL (ref 0.3–1.2)
Total Protein: 7.4 g/dL (ref 6.5–8.1)

## 2019-07-04 LAB — LIPID PANEL
Cholesterol: 180 mg/dL (ref 0–200)
HDL: 42 mg/dL (ref >40)
LDL Cholesterol: 127 mg/dL — ABNORMAL HIGH (ref 0–99)
Total CHOL/HDL Ratio: 4.3 RATIO
Triglycerides: 57 mg/dL (ref <150)
VLDL: 11 mg/dL (ref 0–40)

## 2019-07-04 LAB — URINALYSIS, ROUTINE W REFLEX MICROSCOPIC
Bilirubin Urine: NEGATIVE
Glucose, UA: NEGATIVE mg/dL
Hgb urine dipstick: NEGATIVE
Ketones, ur: NEGATIVE mg/dL
Leukocytes,Ua: NEGATIVE
Nitrite: NEGATIVE
Protein, ur: NEGATIVE mg/dL
Specific Gravity, Urine: 1.023 (ref 1.005–1.030)
pH: 5 (ref 5.0–8.0)

## 2019-07-04 LAB — CBC
HCT: 41.2 % (ref 39.0–52.0)
Hemoglobin: 12.8 g/dL — ABNORMAL LOW (ref 13.0–17.0)
MCH: 26.8 pg (ref 26.0–34.0)
MCHC: 31.1 g/dL (ref 30.0–36.0)
MCV: 86.4 fL (ref 80.0–100.0)
Platelets: 245 10*3/uL (ref 150–400)
RBC: 4.77 MIL/uL (ref 4.22–5.81)
RDW: 13.2 % (ref 11.5–15.5)
WBC: 4.4 10*3/uL (ref 4.0–10.5)
nRBC: 0 % (ref 0.0–0.2)

## 2019-07-08 DIAGNOSIS — C61 Malignant neoplasm of prostate: Secondary | ICD-10-CM | POA: Diagnosis not present

## 2019-07-08 DIAGNOSIS — I10 Essential (primary) hypertension: Secondary | ICD-10-CM | POA: Diagnosis not present

## 2019-07-18 ENCOUNTER — Other Ambulatory Visit: Payer: Self-pay | Admitting: Internal Medicine

## 2019-07-27 DIAGNOSIS — Z8042 Family history of malignant neoplasm of prostate: Secondary | ICD-10-CM | POA: Diagnosis not present

## 2019-07-27 DIAGNOSIS — C61 Malignant neoplasm of prostate: Secondary | ICD-10-CM | POA: Diagnosis not present

## 2020-02-09 ENCOUNTER — Other Ambulatory Visit: Payer: Self-pay | Admitting: Urology

## 2020-03-06 DIAGNOSIS — K4091 Unilateral inguinal hernia, without obstruction or gangrene, recurrent: Secondary | ICD-10-CM | POA: Diagnosis not present

## 2020-03-06 DIAGNOSIS — I10 Essential (primary) hypertension: Secondary | ICD-10-CM | POA: Diagnosis not present

## 2020-06-13 ENCOUNTER — Other Ambulatory Visit: Payer: Self-pay | Admitting: Urology

## 2020-06-13 DIAGNOSIS — C61 Malignant neoplasm of prostate: Secondary | ICD-10-CM | POA: Diagnosis not present

## 2020-06-13 DIAGNOSIS — N5201 Erectile dysfunction due to arterial insufficiency: Secondary | ICD-10-CM | POA: Diagnosis not present

## 2020-06-29 ENCOUNTER — Other Ambulatory Visit: Payer: Self-pay | Admitting: Internal Medicine

## 2020-07-03 ENCOUNTER — Other Ambulatory Visit: Payer: Self-pay

## 2020-07-06 ENCOUNTER — Other Ambulatory Visit: Payer: Self-pay

## 2020-07-06 MED FILL — Zoster Vac Recombinant Adjuvanted for IM Inj 50 MCG/0.5ML: INTRAMUSCULAR | 1 days supply | Qty: 1 | Fill #0 | Status: AC

## 2020-07-17 ENCOUNTER — Ambulatory Visit: Payer: 59 | Attending: Internal Medicine

## 2020-07-17 DIAGNOSIS — Z23 Encounter for immunization: Secondary | ICD-10-CM

## 2020-07-17 NOTE — Progress Notes (Signed)
   Covid-19 Vaccination Clinic  Name:  Douglas Benitez    MRN: [id number] DOB: 1970/09/20  07/17/2020  [name] was observed post Covid-19 immunization for 15 minutes without incident. He was provided with Vaccine Information Sheet and instruction to access the V-Safe system.   [name] was instructed to call 911 with any severe reactions post vaccine: Marland Kitchen Difficulty breathing  . Swelling of face and throat  . A fast heartbeat  . A bad rash all over body  . Dizziness and weakness   Immunizations Administered    Name Date Dose VIS Date Route   PFIZER Comrnaty(Gray TOP) Covid-19 Vaccine 07/17/2020  2:26 PM 0.3 mL 03/08/2020 Intramuscular   Manufacturer: [employer]   Lot: [id number]   [place]: [phone]

## 2020-07-19 ENCOUNTER — Other Ambulatory Visit: Payer: Self-pay

## 2020-07-19 MED ORDER — PFIZER-BIONT COVID-19 VAC-TRIS 30 MCG/0.3ML IM SUSP
INTRAMUSCULAR | 0 refills | Status: DC
  Filled 2020-07-19: qty 0.3, 1d supply, fill #0

## 2020-07-27 ENCOUNTER — Other Ambulatory Visit: Payer: Self-pay

## 2020-07-29 MED FILL — Tadalafil Tab 20 MG: ORAL | 30 days supply | Qty: 6 | Fill #0 | Status: AC

## 2020-07-30 ENCOUNTER — Other Ambulatory Visit: Payer: Self-pay

## 2020-09-13 ENCOUNTER — Other Ambulatory Visit: Payer: Self-pay

## 2020-09-25 DIAGNOSIS — C61 Malignant neoplasm of prostate: Secondary | ICD-10-CM | POA: Diagnosis not present

## 2020-09-30 MED FILL — Tadalafil Tab 20 MG: ORAL | 30 days supply | Qty: 6 | Fill #1 | Status: AC

## 2020-10-02 ENCOUNTER — Other Ambulatory Visit: Payer: Self-pay

## 2020-10-02 MED FILL — Zoster Vac Recombinant Adjuvanted for IM Inj 50 MCG/0.5ML: INTRAMUSCULAR | 1 days supply | Qty: 1 | Fill #1 | Status: AC

## 2020-10-06 ENCOUNTER — Other Ambulatory Visit
Admission: RE | Admit: 2020-10-06 | Discharge: 2020-10-06 | Disposition: A | Discharge status: Home / Self Care | Payer: 59 | Attending: Internal Medicine | Admitting: Internal Medicine

## 2020-10-06 DIAGNOSIS — M25519 Pain in unspecified shoulder: Secondary | ICD-10-CM | POA: Insufficient documentation

## 2020-10-06 DIAGNOSIS — I10 Essential (primary) hypertension: Secondary | ICD-10-CM | POA: Diagnosis not present

## 2020-10-06 DIAGNOSIS — E236 Other disorders of pituitary gland: Secondary | ICD-10-CM | POA: Diagnosis not present

## 2020-10-06 DIAGNOSIS — C61 Malignant neoplasm of prostate: Secondary | ICD-10-CM | POA: Insufficient documentation

## 2020-10-06 LAB — COMPREHENSIVE METABOLIC PANEL
ALT: 24 U/L (ref 0–44)
AST: 23 U/L (ref 15–41)
Albumin: 4.2 g/dL (ref 3.5–5.0)
Alkaline Phosphatase: 54 U/L (ref 38–126)
Anion gap: 7 (ref 5–15)
BUN: 18 mg/dL (ref 6–20)
CO2: 28 mmol/L (ref 22–32)
Calcium: 9.3 mg/dL (ref 8.9–10.3)
Chloride: 105 mmol/L (ref 98–111)
Creatinine, Ser: 1.03 mg/dL (ref 0.61–1.24)
GFR, Estimated: >60 mL/min (ref >60)
Glucose, Bld: 102 mg/dL — ABNORMAL HIGH (ref 70–99)
Potassium: 3.7 mmol/L (ref 3.5–5.1)
Sodium: 140 mmol/L (ref 135–145)
Total Bilirubin: 0.5 mg/dL (ref 0.3–1.2)
Total Protein: 7.8 g/dL (ref 6.5–8.1)

## 2020-10-06 LAB — CBC
HCT: 40.1 % (ref 39.0–52.0)
Hemoglobin: 13 g/dL (ref 13.0–17.0)
MCH: 27.4 pg (ref 26.0–34.0)
MCHC: 32.4 g/dL (ref 30.0–36.0)
MCV: 84.4 fL (ref 80.0–100.0)
Platelets: 232 10*3/uL (ref 150–400)
RBC: 4.75 MIL/uL (ref 4.22–5.81)
RDW: 13.1 % (ref 11.5–15.5)
WBC: 4.6 10*3/uL (ref 4.0–10.5)
nRBC: 0 % (ref 0.0–0.2)

## 2020-10-06 LAB — URINALYSIS, COMPLETE (UACMP) WITH MICROSCOPIC
Bacteria, UA: NONE SEEN
Bilirubin Urine: NEGATIVE
Glucose, UA: NEGATIVE mg/dL
Ketones, ur: NEGATIVE mg/dL
Leukocytes,Ua: NEGATIVE
Nitrite: NEGATIVE
Protein, ur: NEGATIVE mg/dL
Specific Gravity, Urine: 1.025 (ref 1.005–1.030)
Squamous Epithelial / HPF: NONE SEEN (ref 0–5)
pH: 5 (ref 5.0–8.0)

## 2020-10-06 LAB — LIPID PANEL
Cholesterol: 186 mg/dL (ref 0–200)
HDL: 51 mg/dL (ref >40)
LDL Cholesterol: 125 mg/dL — ABNORMAL HIGH (ref 0–99)
Total CHOL/HDL Ratio: 3.6 RATIO
Triglycerides: 50 mg/dL (ref <150)
VLDL: 10 mg/dL (ref 0–40)

## 2020-10-10 ENCOUNTER — Other Ambulatory Visit: Payer: Self-pay | Admitting: Urology

## 2020-10-10 DIAGNOSIS — C61 Malignant neoplasm of prostate: Secondary | ICD-10-CM

## 2020-10-15 DIAGNOSIS — N529 Male erectile dysfunction, unspecified: Secondary | ICD-10-CM | POA: Diagnosis not present

## 2020-10-15 DIAGNOSIS — I10 Essential (primary) hypertension: Secondary | ICD-10-CM | POA: Diagnosis not present

## 2020-10-15 DIAGNOSIS — C61 Malignant neoplasm of prostate: Secondary | ICD-10-CM | POA: Diagnosis not present

## 2020-10-15 DIAGNOSIS — Z6825 Body mass index (BMI) 25.0-25.9, adult: Secondary | ICD-10-CM | POA: Diagnosis not present

## 2020-10-15 DIAGNOSIS — Z0000 Encounter for general adult medical examination without abnormal findings: Secondary | ICD-10-CM | POA: Diagnosis not present

## 2020-11-02 ENCOUNTER — Ambulatory Visit
Admission: RE | Admit: 2020-11-02 | Discharge: 2020-11-02 | Disposition: A | Discharge status: Home / Self Care | Payer: 59 | Source: Ambulatory Visit | Attending: Urology | Admitting: Urology

## 2020-11-02 ENCOUNTER — Other Ambulatory Visit: Payer: Self-pay

## 2020-11-02 DIAGNOSIS — C61 Malignant neoplasm of prostate: Secondary | ICD-10-CM

## 2020-11-02 DIAGNOSIS — N400 Enlarged prostate without lower urinary tract symptoms: Secondary | ICD-10-CM | POA: Diagnosis not present

## 2020-11-02 DIAGNOSIS — N402 Nodular prostate without lower urinary tract symptoms: Secondary | ICD-10-CM | POA: Diagnosis not present

## 2020-11-02 DIAGNOSIS — R590 Localized enlarged lymph nodes: Secondary | ICD-10-CM | POA: Diagnosis not present

## 2020-11-02 DIAGNOSIS — R972 Elevated prostate specific antigen [PSA]: Secondary | ICD-10-CM | POA: Diagnosis not present

## 2020-11-02 MED ORDER — GADOBENATE DIMEGLUMINE 529 MG/ML IV SOLN
18.0000 mL | Freq: Once | INTRAVENOUS | Status: AC | PRN
  Administered 2020-11-02: 18 mL via INTRAVENOUS

## 2020-11-12 ENCOUNTER — Other Ambulatory Visit: Payer: Self-pay

## 2020-11-12 MED ORDER — AMLODIPINE BESYLATE 10 MG PO TABS
ORAL_TABLET | ORAL | 4 refills | Status: AC
  Filled 2020-11-12: qty 90, 90d supply, fill #0
  Filled 2021-03-27: qty 90, 90d supply, fill #1
  Filled 2021-07-31: qty 90, 90d supply, fill #2
  Filled 2021-11-11: qty 90, 90d supply, fill #3

## 2020-11-12 MED ORDER — VALSARTAN-HYDROCHLOROTHIAZIDE 320-12.5 MG PO TABS
ORAL_TABLET | ORAL | 4 refills | Status: AC
  Filled 2020-11-12: qty 90, 90d supply, fill #0
  Filled 2021-03-27: qty 90, 90d supply, fill #1
  Filled 2021-07-31: qty 90, 90d supply, fill #2
  Filled 2021-11-11: qty 90, 90d supply, fill #3

## 2020-11-13 ENCOUNTER — Ambulatory Visit: Payer: 59 | Admitting: General Surgery

## 2020-11-13 ENCOUNTER — Encounter: Payer: Self-pay | Admitting: General Surgery

## 2020-11-13 ENCOUNTER — Other Ambulatory Visit: Payer: Self-pay

## 2020-11-13 VITALS — BP 125/83 | HR 74 | Temp 98.7°F | Resp 12 | Ht 73.0 in | Wt 196.0 lb

## 2020-11-13 DIAGNOSIS — K4090 Unilateral inguinal hernia, without obstruction or gangrene, not specified as recurrent: Secondary | ICD-10-CM | POA: Diagnosis not present

## 2020-11-14 NOTE — Progress Notes (Signed)
Douglas Benitez; [id number]; [birth year]   HPI Patient is a 50 year old black male who was referred to my care by Dr. Asencion Noble for evaluation and treatment of a left inguinal hernia.  Patient states the hernia has been present for many months, but is increasingly causing him discomfort when straining or standing for long periods of time.  He denies any nausea or vomiting. Past Medical History:  Diagnosis Date   Hypertension     Past Surgical History:  Procedure Laterality Date   COLONOSCOPY WITH PROPOFOL N/A 07/24/2017   Procedure: COLONOSCOPY WITH PROPOFOL;  Surgeon: [name], MD;  Location: [facility];  Service: Endoscopy;  Laterality: N/A;   KNEE ARTHROSCOPY      Family History  Problem Relation Age of Onset   Hypertension Mother    Cancer Mother    Cancer Father     Current Outpatient Medications on File Prior to Visit  Medication Sig Dispense Refill   amLODipine (NORVASC) 10 MG tablet Take one tablet by mouth daily to keep blood pressure <140/90 90 tablet 4   tadalafil (CIALIS) 20 MG tablet TAKE 1 TABLET BY MOUTH AS DIRECTED AS NEEDED 30 tablet 11   valsartan-hydrochlorothiazide (DIOVAN-HCT) 320-12.5 MG tablet Take one tablet by mouth daily 90 tablet 4   testosterone cypionate (DEPOTESTOTERONE CYPIONATE) 200 MG/ML injection Inject into the muscle every 14 (fourteen) days.   (Patient not taking: Reported on 11/13/2020)     Current Facility-Administered Medications on File Prior to Visit  Medication Dose Route Frequency Provider Last Rate Last Admin   dexamethasone (DECADRON) injection 10 mg  10 mg Other Once Molli Barrows, MD       dexamethasone (DECADRON) injection 10 mg  10 mg Other Once Molli Barrows, MD       dexamethasone (DECADRON) injection 10 mg  10 mg Other Once Molli Barrows, MD       dexamethasone (DECADRON) injection 4 mg  4 mg Other Once Molli Barrows, MD       ropivacaine (PF) 2 mg/ml (0.2%) (NAROPIN) epidural 1 mL  1 mL Epidural Once Molli Barrows, MD       ropivacaine (PF) 2 mg/ml (0.2%) (NAROPIN) epidural 10 mL  10 mL Epidural Once Molli Barrows, MD       ropivacaine (PF) 2 mg/mL (0.2%) (NAROPIN) injection 10 mL  10 mL Epidural Once Molli Barrows, MD        No Known Allergies  Social History   Substance and Sexual Activity  Alcohol Use No    Social History   Tobacco Use  Smoking Status Never  Smokeless Tobacco Never    Review of Systems  Constitutional: Negative.   HENT: Negative.    Eyes: Negative.   Respiratory: Negative.    Cardiovascular: Negative.   Gastrointestinal: Negative.   Genitourinary: Negative.   Musculoskeletal: Negative.   Skin: Negative.   Neurological: Negative.   Endo/Heme/Allergies: Negative.   Psychiatric/Behavioral: Negative.     Objective   Vitals:   11/13/20 1457  BP: 125/83  Pulse: 74  Resp: 12  Temp: 98.7 F (37.1 C)  SpO2: 98%    Physical Exam Vitals reviewed.  Constitutional:      Appearance: Normal appearance. He is normal weight. He is not ill-appearing.  HENT:     Head: Normocephalic and atraumatic.  Cardiovascular:     Rate and Rhythm: Normal rate and regular rhythm.     Heart sounds: Normal heart sounds. No murmur  heard.   No friction rub. No gallop.  Pulmonary:     Effort: Pulmonary effort is normal. No respiratory distress.     Breath sounds: Normal breath sounds. No stridor. No wheezing, rhonchi or rales.  Abdominal:     General: Abdomen is flat. Bowel sounds are normal. There is no distension.     Palpations: Abdomen is soft. There is no mass.     Tenderness: There is no abdominal tenderness. There is no guarding or rebound.     Hernia: A hernia is present.     Comments: Reducible left inguinal hernia noted.  Weak right inguinal hernia floor noted.  Genitourinary:    Testes: Normal.  Skin:    General: Skin is warm and dry.  Neurological:     Mental Status: He is alert and oriented to person, place, and time.    Assessment  Left inguinal  hernia Plan  Patient is scheduled for left inguinal herniorrhaphy with mesh on 01/25/2021.  The risks and benefits of the procedure including bleeding, infection, mesh use, and the possibility of recurrence of the hernia were fully explained to the patient, who gave informed consent.

## 2020-11-23 MED FILL — Tadalafil Tab 20 MG: ORAL | 30 days supply | Qty: 6 | Fill #2 | Status: AC

## 2020-11-26 ENCOUNTER — Other Ambulatory Visit: Payer: Self-pay

## 2020-12-07 ENCOUNTER — Other Ambulatory Visit
Admission: RE | Admit: 2020-12-07 | Discharge: 2020-12-07 | Disposition: A | Discharge status: Home / Self Care | Payer: 59 | Source: Ambulatory Visit | Attending: Urology | Admitting: Urology

## 2020-12-07 ENCOUNTER — Other Ambulatory Visit: Payer: Self-pay | Admitting: Urology

## 2020-12-07 DIAGNOSIS — C61 Malignant neoplasm of prostate: Secondary | ICD-10-CM

## 2020-12-07 LAB — PSA: Prostatic Specific Antigen: 6.27 ng/mL — ABNORMAL HIGH (ref 0.00–4.00)

## 2020-12-28 NOTE — H&P (Signed)
Douglas Benitez; [id number]; [birth year]   HPI Patient is a 50 year old black male who was referred to my care by Dr. Asencion Noble for evaluation and treatment of a left inguinal hernia.  Patient states the hernia has been present for many months, but is increasingly causing him discomfort when straining or standing for long periods of time.  He denies any nausea or vomiting. Past Medical History:  Diagnosis Date   Hypertension     Past Surgical History:  Procedure Laterality Date   COLONOSCOPY WITH PROPOFOL N/A 07/24/2017   Procedure: COLONOSCOPY WITH PROPOFOL;  Surgeon: [name], MD;  Location: [facility];  Service: Endoscopy;  Laterality: N/A;   KNEE ARTHROSCOPY      Family History  Problem Relation Age of Onset   Hypertension Mother    Cancer Mother    Cancer Father     Current Outpatient Medications on File Prior to Visit  Medication Sig Dispense Refill   amLODipine (NORVASC) 10 MG tablet Take one tablet by mouth daily to keep blood pressure <140/90 90 tablet 4   tadalafil (CIALIS) 20 MG tablet TAKE 1 TABLET BY MOUTH AS DIRECTED AS NEEDED 30 tablet 11   valsartan-hydrochlorothiazide (DIOVAN-HCT) 320-12.5 MG tablet Take one tablet by mouth daily 90 tablet 4   testosterone cypionate (DEPOTESTOTERONE CYPIONATE) 200 MG/ML injection Inject into the muscle every 14 (fourteen) days.   (Patient not taking: Reported on 11/13/2020)     Current Facility-Administered Medications on File Prior to Visit  Medication Dose Route Frequency Provider Last Rate Last Admin   dexamethasone (DECADRON) injection 10 mg  10 mg Other Once Molli Barrows, MD       dexamethasone (DECADRON) injection 10 mg  10 mg Other Once Molli Barrows, MD       dexamethasone (DECADRON) injection 10 mg  10 mg Other Once Molli Barrows, MD       dexamethasone (DECADRON) injection 4 mg  4 mg Other Once Molli Barrows, MD       ropivacaine (PF) 2 mg/ml (0.2%) (NAROPIN) epidural 1 mL  1 mL Epidural Once Molli Barrows, MD       ropivacaine (PF) 2 mg/ml (0.2%) (NAROPIN) epidural 10 mL  10 mL Epidural Once Molli Barrows, MD       ropivacaine (PF) 2 mg/mL (0.2%) (NAROPIN) injection 10 mL  10 mL Epidural Once Molli Barrows, MD        No Known Allergies  Social History   Substance and Sexual Activity  Alcohol Use No    Social History   Tobacco Use  Smoking Status Never  Smokeless Tobacco Never    Review of Systems  Constitutional: Negative.   HENT: Negative.    Eyes: Negative.   Respiratory: Negative.    Cardiovascular: Negative.   Gastrointestinal: Negative.   Genitourinary: Negative.   Musculoskeletal: Negative.   Skin: Negative.   Neurological: Negative.   Endo/Heme/Allergies: Negative.   Psychiatric/Behavioral: Negative.     Objective   Vitals:   11/13/20 1457  BP: 125/83  Pulse: 74  Resp: 12  Temp: 98.7 F (37.1 C)  SpO2: 98%    Physical Exam Vitals reviewed.  Constitutional:      Appearance: Normal appearance. He is normal weight. He is not ill-appearing.  HENT:     Head: Normocephalic and atraumatic.  Cardiovascular:     Rate and Rhythm: Normal rate and regular rhythm.     Heart sounds: Normal heart sounds. No murmur  heard.   No friction rub. No gallop.  Pulmonary:     Effort: Pulmonary effort is normal. No respiratory distress.     Breath sounds: Normal breath sounds. No stridor. No wheezing, rhonchi or rales.  Abdominal:     General: Abdomen is flat. Bowel sounds are normal. There is no distension.     Palpations: Abdomen is soft. There is no mass.     Tenderness: There is no abdominal tenderness. There is no guarding or rebound.     Hernia: A hernia is present.     Comments: Reducible left inguinal hernia noted.  Weak right inguinal hernia floor noted.  Genitourinary:    Testes: Normal.  Skin:    General: Skin is warm and dry.  Neurological:     Mental Status: He is alert and oriented to person, place, and time.    Assessment  Left inguinal  hernia Plan  Patient is scheduled for left inguinal herniorrhaphy with mesh on 01/25/2021.  The risks and benefits of the procedure including bleeding, infection, mesh use, and the possibility of recurrence of the hernia were fully explained to the patient, who gave informed consent.

## 2021-01-03 ENCOUNTER — Other Ambulatory Visit: Payer: Self-pay

## 2021-01-04 ENCOUNTER — Other Ambulatory Visit: Payer: Self-pay

## 2021-01-04 ENCOUNTER — Ambulatory Visit: Payer: 59 | Attending: Internal Medicine

## 2021-01-04 DIAGNOSIS — Z23 Encounter for immunization: Secondary | ICD-10-CM

## 2021-01-04 MED ORDER — PFIZER COVID-19 VAC BIVALENT 30 MCG/0.3ML IM SUSP
INTRAMUSCULAR | 0 refills | Status: AC
  Filled 2021-01-04: qty 0.3, 1d supply, fill #0

## 2021-01-04 NOTE — Progress Notes (Signed)
   Covid-19 Vaccination Clinic  Name:  Douglas Benitez    MRN: [id number] DOB: 10-17-1970  01/04/2021  Mr. Douglas Benitez was observed post Covid-19 immunization for 15 minutes without incident. He was provided with Vaccine Information Sheet and instruction to access the V-Safe system.   Mr. Douglas Benitez was instructed to call 911 with any severe reactions post vaccine: Difficulty breathing  Swelling of face and throat  A fast heartbeat  A bad rash all over body  Dizziness and weakness   Lu Duffel, PharmD, MBA Clinical Acute Care Pharmacist

## 2021-01-23 ENCOUNTER — Encounter (HOSPITAL_COMMUNITY): Admission: RE | Admit: 2021-01-23 | Payer: 59 | Source: Ambulatory Visit

## 2021-01-25 ENCOUNTER — Ambulatory Visit: Admit: 2021-01-25 | Payer: 59 | Admitting: General Surgery

## 2021-01-25 SURGERY — REPAIR, HERNIA, INGUINAL, ADULT
Anesthesia: General | Laterality: Left

## 2021-02-02 MED FILL — Tadalafil Tab 20 MG: ORAL | 30 days supply | Qty: 6 | Fill #3 | Status: AC

## 2021-02-04 ENCOUNTER — Other Ambulatory Visit: Payer: Self-pay

## 2021-02-05 ENCOUNTER — Other Ambulatory Visit: Payer: Self-pay

## 2021-03-19 MED FILL — Tadalafil Tab 20 MG: ORAL | 30 days supply | Qty: 6 | Fill #4 | Status: AC

## 2021-03-20 ENCOUNTER — Other Ambulatory Visit: Payer: Self-pay

## 2021-03-27 ENCOUNTER — Other Ambulatory Visit: Payer: Self-pay

## 2021-05-01 DIAGNOSIS — N5201 Erectile dysfunction due to arterial insufficiency: Secondary | ICD-10-CM | POA: Diagnosis not present

## 2021-05-01 DIAGNOSIS — C61 Malignant neoplasm of prostate: Secondary | ICD-10-CM | POA: Diagnosis not present

## 2021-05-01 MED FILL — Tadalafil Tab 20 MG: ORAL | 30 days supply | Qty: 6 | Fill #5 | Status: AC

## 2021-05-02 ENCOUNTER — Other Ambulatory Visit: Payer: Self-pay

## 2021-05-07 ENCOUNTER — Other Ambulatory Visit
Admission: RE | Admit: 2021-05-07 | Discharge: 2021-05-07 | Disposition: A | Discharge status: Home / Self Care | Payer: 59 | Source: Ambulatory Visit | Attending: Urology | Admitting: Urology

## 2021-05-07 DIAGNOSIS — C61 Malignant neoplasm of prostate: Secondary | ICD-10-CM | POA: Insufficient documentation

## 2021-05-07 LAB — PSA: Prostatic Specific Antigen: 6.04 ng/mL — ABNORMAL HIGH (ref 0.00–4.00)

## 2021-06-16 ENCOUNTER — Other Ambulatory Visit: Payer: Self-pay | Admitting: Urology

## 2021-06-17 ENCOUNTER — Other Ambulatory Visit: Payer: Self-pay

## 2021-06-20 ENCOUNTER — Other Ambulatory Visit: Payer: Self-pay

## 2021-06-20 MED ORDER — TADALAFIL 20 MG PO TABS
ORAL_TABLET | ORAL | 11 refills | Status: DC
  Filled 2021-06-20: qty 6, 30d supply, fill #0
  Filled 2021-07-31: qty 6, 30d supply, fill #1
  Filled 2021-09-01: qty 6, 30d supply, fill #2
  Filled 2021-09-26: qty 6, 30d supply, fill #3
  Filled 2021-11-11: qty 6, 30d supply, fill #4
  Filled 2021-12-20: qty 6, 30d supply, fill #5
  Filled 2022-02-03: qty 6, 30d supply, fill #6
  Filled 2022-03-10: qty 6, 30d supply, fill #7
  Filled 2022-04-10: qty 6, 30d supply, fill #8
  Filled 2022-05-05: qty 6, 30d supply, fill #9
  Filled 2022-06-09: qty 6, 30d supply, fill #10

## 2021-06-21 ENCOUNTER — Other Ambulatory Visit: Payer: Self-pay

## 2021-07-31 ENCOUNTER — Other Ambulatory Visit: Payer: Self-pay

## 2021-09-02 ENCOUNTER — Other Ambulatory Visit: Payer: Self-pay

## 2021-09-25 DIAGNOSIS — H524 Presbyopia: Secondary | ICD-10-CM | POA: Diagnosis not present

## 2021-09-26 ENCOUNTER — Other Ambulatory Visit: Payer: Self-pay

## 2021-09-29 IMAGING — MR MR PROSTATE WO/W CM
12 series · 48 of 48 positions shown · IV contrast (18 ml multihance)
Comparison: MRI 11/08/2014, 04/21/2018

CLINICAL DATA: Elevated PSA. 50-year-old male. PSA equal 7.0.
Biopsy collected 07/19/2019 with benign results.

EXAM:
MR PROSTATE WITHOUT AND WITH CONTRAST
TECHNIQUE: Multiplanar multisequence MRI images were obtained of the pelvis
centered about the prostate. Pre and post contrast images were
obtained.
CONTRAST:  18mL MULTIHANCE GADOBENATE DIMEGLUMINE 529 MG/ML IV SOLN

[Series 3: T2 · coronal · 3.0mm · 0.56mm/px · 1 of 23 slices shown (1 of 3)]
[im 1/23]
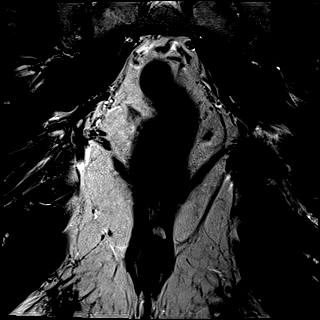

[Series 4: T1 · axial · 5.0mm · 1.25mm/px · 1 of 96 slices shown]
[im 1/96]
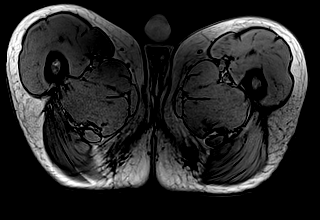

[Series 5: DWI · axial · 3.0mm · 1.75mm/px · 1 of 87 slices shown (1 of 3)]
[im 1/87]
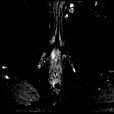

[Series 6: DWI · axial · 3.0mm · 1.75mm/px · 1 of 29 slices shown (2 of 3)]
[im 1/29]
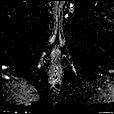

[Series 7: DWI · axial · 3.0mm · 1.75mm/px · 1 of 29 slices shown (3 of 3)]
[im 1/29]
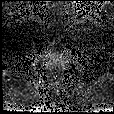

[Series 8: T2 · axial · 3.0mm · 0.56mm/px · 1 of 26 slices shown (2 of 3)]
[im 1/26]
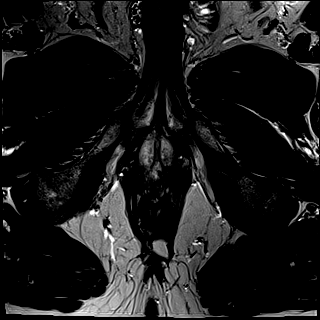

[Series 9: T2 · axial · 1.0mm · 1.04mm/px · z∈[-22,+57]mm · 2 of 80 slices shown (3 of 3)]
[im 1/80]
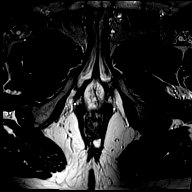
[im 80/80]
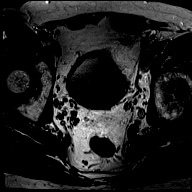

[Series 10: pre t1_twist_tra_dyn · axial · non-contrast · 3.5mm · 0.83mm/px · 1 of 24 slices shown]
[im 1/24]
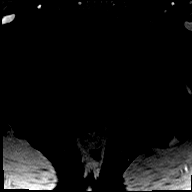

[Series 11: post t1_twist_tra_dyn-copy center · axial · non-contrast · 3.5mm · 0.83mm/px · z∈[-22,+58]mm · 18 of 720 slices shown]
[im 1/720]
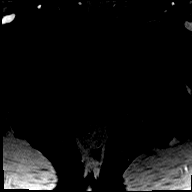
[im 43/720]
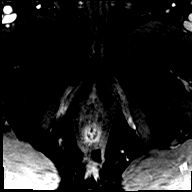
[im 85/720]
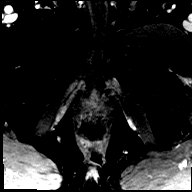
[im 127/720]
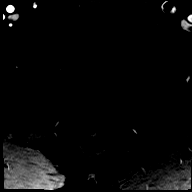
[im 170/720]
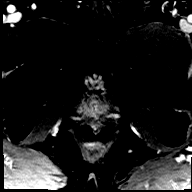
[im 212/720]
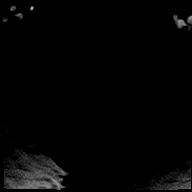
[im 254/720]
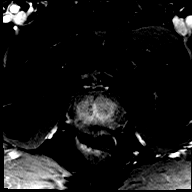
[im 297/720]
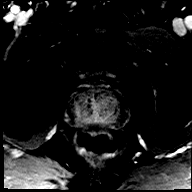
[im 339/720]
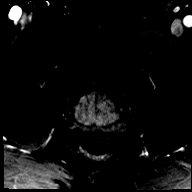
[im 381/720]
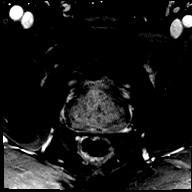
[im 423/720]
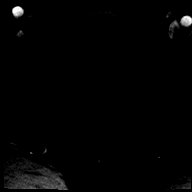
[im 466/720]
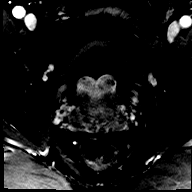
[im 508/720]
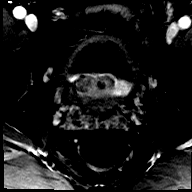
[im 550/720]
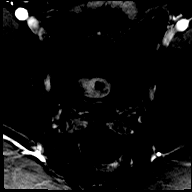
[im 593/720]
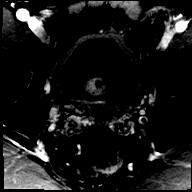
[im 635/720]
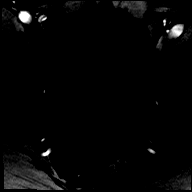
[im 677/720]
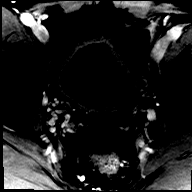
[im 720/720]
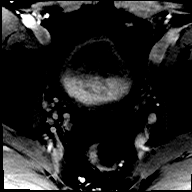

[Series 12: post t1_twist_tra_dyn-copy cent_sub · axial · 3.5mm · 0.83mm/px · z∈[-22,+58]mm · 17 of 694 slices shown]
[im 1/694]
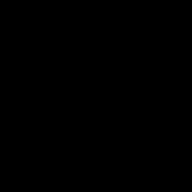
[im 44/694]
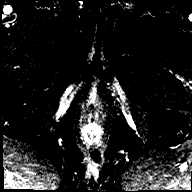
[im 87/694]
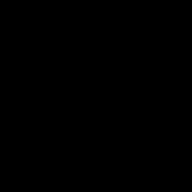
[im 130/694]
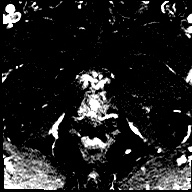
[im 174/694]
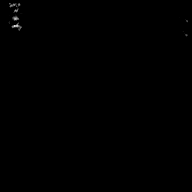
[im 217/694]
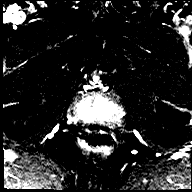
[im 260/694]
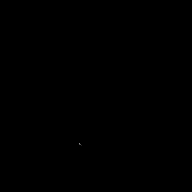
[im 304/694]
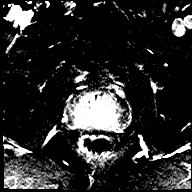
[im 347/694]
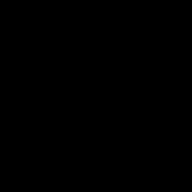
[im 390/694]
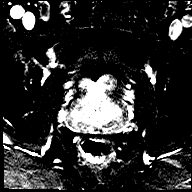
[im 434/694]
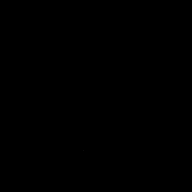
[im 477/694]
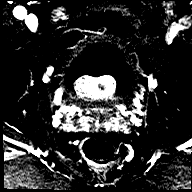
[im 520/694]
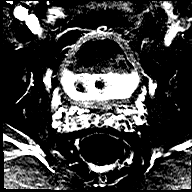
[im 564/694]
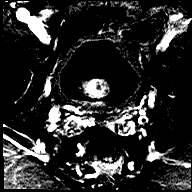
[im 607/694]
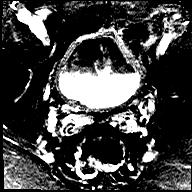
[im 650/694]
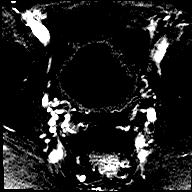
[im 694/694]
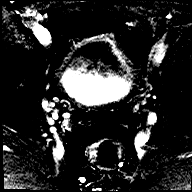

[Series 13: t1_vibe_dixon_tra_f · axial · 2.5mm · 0.91mm/px · z∈[-49,+149]mm · 2 of 80 slices shown]
[im 1/80]
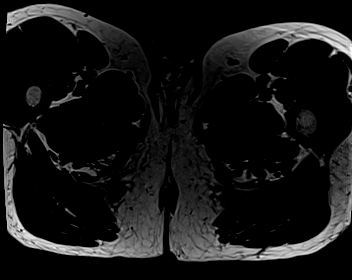
[im 80/80]
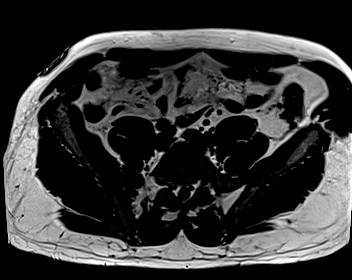

[Series 14: t1_vibe_dixon_tra_w · axial · 2.5mm · 0.91mm/px · z∈[-49,+149]mm · 2 of 80 slices shown]
[im 1/80]
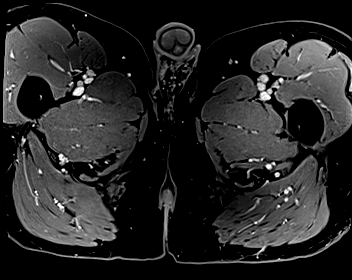
[im 80/80]
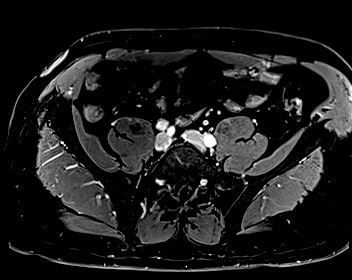

[48 of 48 positions shown; findings below may reference images not displayed]

FINDINGS: Prostate: There are no foci of restricted diffusion within the
peripheral zone (series 5 and series 6). Normal high signal
intensity within the peripheral zone on T2 weighted imaging without
focal lesion (series 8).

Transitional zone is enlarged by well capsulated nodules without
suspicious imaging characteristics on T2 weighted imaging. Nodular
extension into the base the bladder.

Seminal vesicles are normal.  Prostate capsule intact.

Postcontrast enhanced imaging demonstrates no suspicious enhancement
pattern.

Volume:

Prostate gland 3.9 x 5.4 x 3.2 cm (volume = 35 cm^3).

Nodular extension into the base the bladder measures 2.9 by 3.3 x
1.9 cm (volume = 9.5 cm^3).

Total volume equals 45 cubic cm.

Transcapsular spread:  Absent

Seminal vesicle involvement: Absent

Neurovascular bundle involvement: Absent

Pelvic adenopathy: Absent

Bone metastasis: Absent

Other findings: None
IMPRESSION: 1. No high-grade carcinoma within the peripheral zone. No change
from comparison exam. PI-RADS: 1.
2. Enlarged nodular transitional zone most consistent with benign
prostate hypertrophy. PI-RADS: 2

## 2021-11-01 ENCOUNTER — Other Ambulatory Visit
Admission: RE | Admit: 2021-11-01 | Discharge: 2021-11-01 | Disposition: A | Discharge status: Home / Self Care | Payer: 59 | Attending: Urology | Admitting: Urology

## 2021-11-01 DIAGNOSIS — C61 Malignant neoplasm of prostate: Secondary | ICD-10-CM | POA: Diagnosis not present

## 2021-11-01 LAB — PSA: Prostatic Specific Antigen: 5.89 ng/mL — ABNORMAL HIGH (ref 0.00–4.00)

## 2021-11-11 ENCOUNTER — Other Ambulatory Visit: Payer: Self-pay

## 2021-12-17 ENCOUNTER — Other Ambulatory Visit: Payer: Self-pay

## 2021-12-17 DIAGNOSIS — L920 Granuloma annulare: Secondary | ICD-10-CM | POA: Diagnosis not present

## 2021-12-17 MED ORDER — MUPIROCIN 2 % EX OINT
TOPICAL_OINTMENT | CUTANEOUS | 12 refills | Status: AC
  Filled 2021-12-17: qty 22, 15d supply, fill #0

## 2021-12-20 ENCOUNTER — Other Ambulatory Visit: Payer: Self-pay

## 2021-12-20 MED ORDER — BETAMETHASONE DIPROPIONATE 0.05 % EX CREA
TOPICAL_CREAM | CUTANEOUS | 3 refills | Status: AC
  Filled 2021-12-20: qty 45, 30d supply, fill #0
  Filled 2022-05-26: qty 45, 30d supply, fill #1

## 2022-01-09 ENCOUNTER — Other Ambulatory Visit: Payer: Self-pay

## 2022-01-09 ENCOUNTER — Other Ambulatory Visit (HOSPITAL_BASED_OUTPATIENT_CLINIC_OR_DEPARTMENT_OTHER): Payer: Self-pay

## 2022-01-09 MED ORDER — COVID-19 MRNA 2023-2024 VACCINE (COMIRNATY) 0.3 ML INJECTION
INTRAMUSCULAR | 0 refills | Status: AC
  Filled 2022-01-09: qty 0.3, 1d supply, fill #0

## 2022-01-23 ENCOUNTER — Other Ambulatory Visit: Payer: Self-pay

## 2022-01-23 MED ORDER — DOXYCYCLINE HYCLATE 100 MG PO CAPS
ORAL_CAPSULE | ORAL | 0 refills | Status: DC
  Filled 2022-01-23: qty 14, 7d supply, fill #0

## 2022-02-03 ENCOUNTER — Other Ambulatory Visit: Payer: Self-pay

## 2022-02-25 ENCOUNTER — Other Ambulatory Visit: Payer: Self-pay

## 2022-02-25 DIAGNOSIS — C61 Malignant neoplasm of prostate: Secondary | ICD-10-CM | POA: Diagnosis not present

## 2022-02-25 DIAGNOSIS — K4090 Unilateral inguinal hernia, without obstruction or gangrene, not specified as recurrent: Secondary | ICD-10-CM | POA: Diagnosis not present

## 2022-02-25 DIAGNOSIS — I10 Essential (primary) hypertension: Secondary | ICD-10-CM | POA: Diagnosis not present

## 2022-02-25 DIAGNOSIS — Z0000 Encounter for general adult medical examination without abnormal findings: Secondary | ICD-10-CM | POA: Diagnosis not present

## 2022-02-25 MED ORDER — VALSARTAN-HYDROCHLOROTHIAZIDE 320-12.5 MG PO TABS
1.0000 | ORAL_TABLET | Freq: Every day | ORAL | 4 refills | Status: DC
  Filled 2022-02-25 – 2022-04-10 (×2): qty 90, 90d supply, fill #0
  Filled 2022-07-07: qty 90, 90d supply, fill #1
  Filled 2022-12-15: qty 90, 90d supply, fill #2

## 2022-02-25 MED ORDER — AMLODIPINE BESYLATE 10 MG PO TABS
10.0000 mg | ORAL_TABLET | Freq: Every day | ORAL | 4 refills | Status: DC
  Filled 2022-02-25 – 2022-04-10 (×2): qty 90, 90d supply, fill #0
  Filled 2022-07-07: qty 90, 90d supply, fill #1
  Filled 2022-12-15: qty 90, 90d supply, fill #2

## 2022-03-10 ENCOUNTER — Other Ambulatory Visit: Payer: Self-pay

## 2022-03-25 ENCOUNTER — Other Ambulatory Visit: Payer: Self-pay | Admitting: Gastroenterology

## 2022-03-25 ENCOUNTER — Other Ambulatory Visit: Payer: Self-pay

## 2022-03-25 ENCOUNTER — Other Ambulatory Visit (HOSPITAL_COMMUNITY): Payer: Self-pay

## 2022-03-25 DIAGNOSIS — J329 Chronic sinusitis, unspecified: Secondary | ICD-10-CM

## 2022-03-25 MED ORDER — DOXYCYCLINE HYCLATE 100 MG PO TABS
100.0000 mg | ORAL_TABLET | Freq: Two times a day (BID) | ORAL | 0 refills | Status: AC
  Filled 2022-03-25: qty 14, 7d supply, fill #0

## 2022-03-25 MED ORDER — DOXYCYCLINE HYCLATE 100 MG PO TABS
100.0000 mg | ORAL_TABLET | Freq: Two times a day (BID) | ORAL | Status: DC
Start: 2022-03-25 — End: 2022-03-25

## 2022-04-10 ENCOUNTER — Other Ambulatory Visit: Payer: Self-pay

## 2022-05-05 ENCOUNTER — Other Ambulatory Visit: Payer: Self-pay

## 2022-05-26 ENCOUNTER — Other Ambulatory Visit: Payer: Self-pay

## 2022-06-09 ENCOUNTER — Other Ambulatory Visit: Payer: Self-pay

## 2022-07-07 ENCOUNTER — Other Ambulatory Visit: Payer: Self-pay

## 2022-07-08 ENCOUNTER — Other Ambulatory Visit: Payer: Self-pay

## 2022-07-08 MED ORDER — TADALAFIL 20 MG PO TABS
20.0000 mg | ORAL_TABLET | ORAL | 2 refills | Status: AC | PRN
  Filled 2022-07-08: qty 6, 30d supply, fill #0
  Filled 2022-08-31: qty 6, 30d supply, fill #1
  Filled 2022-09-23 – 2022-11-16 (×2): qty 6, 30d supply, fill #2

## 2022-08-05 ENCOUNTER — Other Ambulatory Visit
Admission: RE | Admit: 2022-08-05 | Discharge: 2022-08-05 | Disposition: A | Discharge status: Home / Self Care | Payer: Commercial Managed Care - PPO | Attending: Urology | Admitting: Urology

## 2022-08-05 DIAGNOSIS — C61 Malignant neoplasm of prostate: Secondary | ICD-10-CM | POA: Diagnosis not present

## 2022-08-05 LAB — PSA: Prostatic Specific Antigen: 6.88 ng/mL — ABNORMAL HIGH (ref 0.00–4.00)

## 2022-09-17 ENCOUNTER — Other Ambulatory Visit: Payer: Self-pay

## 2022-09-17 DIAGNOSIS — I10 Essential (primary) hypertension: Secondary | ICD-10-CM | POA: Diagnosis not present

## 2022-09-17 DIAGNOSIS — N529 Male erectile dysfunction, unspecified: Secondary | ICD-10-CM | POA: Diagnosis not present

## 2022-09-17 DIAGNOSIS — C61 Malignant neoplasm of prostate: Secondary | ICD-10-CM | POA: Diagnosis not present

## 2022-09-17 MED ORDER — TADALAFIL 20 MG PO TABS
20.0000 mg | ORAL_TABLET | ORAL | 11 refills | Status: AC | PRN
  Filled 2022-09-17: qty 6, 30d supply, fill #0
  Filled 2022-12-15: qty 6, 30d supply, fill #1
  Filled 2023-01-07 – 2023-01-08 (×2): qty 6, 30d supply, fill #2
  Filled 2023-02-24: qty 6, 30d supply, fill #3
  Filled 2023-05-05: qty 6, 30d supply, fill #4
  Filled 2023-06-09: qty 6, 30d supply, fill #5
  Filled 2023-07-08: qty 6, 30d supply, fill #6
  Filled 2023-08-11: qty 6, 30d supply, fill #7
  Filled 2023-09-04: qty 6, 30d supply, fill #8

## 2022-09-18 ENCOUNTER — Other Ambulatory Visit: Payer: Self-pay

## 2022-09-24 ENCOUNTER — Other Ambulatory Visit: Payer: Self-pay

## 2022-10-06 ENCOUNTER — Other Ambulatory Visit: Payer: Self-pay

## 2022-10-20 ENCOUNTER — Other Ambulatory Visit: Payer: Self-pay | Admitting: Urology

## 2022-10-20 DIAGNOSIS — C61 Malignant neoplasm of prostate: Secondary | ICD-10-CM

## 2022-11-17 ENCOUNTER — Other Ambulatory Visit: Payer: Self-pay

## 2022-12-15 ENCOUNTER — Other Ambulatory Visit: Payer: Self-pay

## 2023-01-02 ENCOUNTER — Other Ambulatory Visit: Payer: Self-pay

## 2023-01-02 MED ORDER — COMIRNATY 30 MCG/0.3ML IM SUSY
0.3000 mL | PREFILLED_SYRINGE | INTRAMUSCULAR | 0 refills | Status: AC
  Filled 2023-01-02: qty 0.3, 1d supply, fill #0

## 2023-01-03 ENCOUNTER — Other Ambulatory Visit: Payer: Commercial Managed Care - PPO

## 2023-01-07 ENCOUNTER — Other Ambulatory Visit: Payer: Self-pay

## 2023-01-07 ENCOUNTER — Other Ambulatory Visit (HOSPITAL_COMMUNITY): Payer: Self-pay

## 2023-02-01 ENCOUNTER — Other Ambulatory Visit: Payer: Commercial Managed Care - PPO

## 2023-02-02 ENCOUNTER — Other Ambulatory Visit: Payer: Self-pay

## 2023-02-03 ENCOUNTER — Other Ambulatory Visit: Payer: Self-pay

## 2023-02-05 ENCOUNTER — Other Ambulatory Visit: Payer: Self-pay

## 2023-02-24 ENCOUNTER — Other Ambulatory Visit: Payer: Self-pay

## 2023-03-12 ENCOUNTER — Other Ambulatory Visit: Payer: Self-pay

## 2023-03-13 ENCOUNTER — Other Ambulatory Visit: Payer: Self-pay

## 2023-03-13 MED ORDER — AMLODIPINE BESYLATE 10 MG PO TABS
10.0000 mg | ORAL_TABLET | Freq: Every day | ORAL | 4 refills | Status: DC
  Filled 2023-03-13: qty 90, 90d supply, fill #0
  Filled 2023-06-09: qty 90, 90d supply, fill #1
  Filled 2023-10-05: qty 90, 90d supply, fill #2

## 2023-03-13 MED ORDER — VALSARTAN-HYDROCHLOROTHIAZIDE 320-12.5 MG PO TABS
1.0000 | ORAL_TABLET | Freq: Every day | ORAL | 4 refills | Status: DC
  Filled 2023-03-13: qty 90, 90d supply, fill #0
  Filled 2023-06-09: qty 90, 90d supply, fill #1
  Filled 2023-10-05: qty 90, 90d supply, fill #2

## 2023-03-19 ENCOUNTER — Other Ambulatory Visit
Admission: RE | Admit: 2023-03-19 | Discharge: 2023-03-19 | Disposition: A | Discharge status: Home / Self Care | Payer: Commercial Managed Care - PPO | Attending: Internal Medicine | Admitting: Internal Medicine

## 2023-03-19 DIAGNOSIS — M25519 Pain in unspecified shoulder: Secondary | ICD-10-CM | POA: Insufficient documentation

## 2023-03-19 DIAGNOSIS — C61 Malignant neoplasm of prostate: Secondary | ICD-10-CM | POA: Diagnosis not present

## 2023-03-19 DIAGNOSIS — I10 Essential (primary) hypertension: Secondary | ICD-10-CM | POA: Diagnosis not present

## 2023-03-19 DIAGNOSIS — E236 Other disorders of pituitary gland: Secondary | ICD-10-CM | POA: Diagnosis not present

## 2023-03-19 LAB — COMPREHENSIVE METABOLIC PANEL
ALT: 29 U/L (ref 0–44)
AST: 24 U/L (ref 15–41)
Albumin: 4.1 g/dL (ref 3.5–5.0)
Alkaline Phosphatase: 55 U/L (ref 38–126)
Anion gap: 10 (ref 5–15)
BUN: 18 mg/dL (ref 6–20)
CO2: 23 mmol/L (ref 22–32)
Calcium: 8.9 mg/dL (ref 8.9–10.3)
Chloride: 106 mmol/L (ref 98–111)
Creatinine, Ser: 1.03 mg/dL (ref 0.61–1.24)
GFR, Estimated: >60 mL/min (ref >60)
Glucose, Bld: 94 mg/dL (ref 70–99)
Potassium: 3.7 mmol/L (ref 3.5–5.1)
Sodium: 139 mmol/L (ref 135–145)
Total Bilirubin: 0.7 mg/dL (ref <1.2)
Total Protein: 7.2 g/dL (ref 6.5–8.1)

## 2023-03-19 LAB — LIPID PANEL
Cholesterol: 175 mg/dL (ref 0–200)
HDL: 44 mg/dL (ref >40)
LDL Cholesterol: 122 mg/dL — ABNORMAL HIGH (ref 0–99)
Total CHOL/HDL Ratio: 4 {ratio}
Triglycerides: 46 mg/dL (ref <150)
VLDL: 9 mg/dL (ref 0–40)

## 2023-03-19 LAB — URINALYSIS, COMPLETE (UACMP) WITH MICROSCOPIC
Bacteria, UA: NONE SEEN
Bilirubin Urine: NEGATIVE
Glucose, UA: NEGATIVE mg/dL
Hgb urine dipstick: NEGATIVE
Ketones, ur: NEGATIVE mg/dL
Leukocytes,Ua: NEGATIVE
Nitrite: NEGATIVE
Protein, ur: NEGATIVE mg/dL
Specific Gravity, Urine: 1.021 (ref 1.005–1.030)
Squamous Epithelial / HPF: 0 /[HPF] (ref 0–5)
WBC, UA: 0 WBC/hpf (ref 0–5)
pH: 7 (ref 5.0–8.0)

## 2023-03-19 LAB — CBC
HCT: 39.5 % (ref 39.0–52.0)
Hemoglobin: 12.5 g/dL — ABNORMAL LOW (ref 13.0–17.0)
MCH: 27 pg (ref 26.0–34.0)
MCHC: 31.6 g/dL (ref 30.0–36.0)
MCV: 85.3 fL (ref 80.0–100.0)
Platelets: 247 10*3/uL (ref 150–400)
RBC: 4.63 MIL/uL (ref 4.22–5.81)
RDW: 13.3 % (ref 11.5–15.5)
WBC: 4.8 10*3/uL (ref 4.0–10.5)
nRBC: 0 % (ref 0.0–0.2)

## 2023-03-19 LAB — PSA: Prostatic Specific Antigen: 7.04 ng/mL — ABNORMAL HIGH (ref 0.00–4.00)

## 2023-03-23 ENCOUNTER — Ambulatory Visit
Admission: RE | Admit: 2023-03-23 | Discharge: 2023-03-23 | Disposition: A | Discharge status: Home / Self Care | Payer: Commercial Managed Care - PPO | Source: Ambulatory Visit | Attending: Urology | Admitting: Urology

## 2023-03-23 DIAGNOSIS — C61 Malignant neoplasm of prostate: Secondary | ICD-10-CM

## 2023-03-23 DIAGNOSIS — R972 Elevated prostate specific antigen [PSA]: Secondary | ICD-10-CM | POA: Diagnosis not present

## 2023-03-23 MED ORDER — GADOPICLENOL 0.5 MMOL/ML IV SOLN
10.0000 mL | Freq: Once | INTRAVENOUS | Status: AC | PRN
  Administered 2023-03-23: 10 mL via INTRAVENOUS

## 2023-04-02 ENCOUNTER — Other Ambulatory Visit: Payer: Self-pay

## 2023-04-02 DIAGNOSIS — N5201 Erectile dysfunction due to arterial insufficiency: Secondary | ICD-10-CM | POA: Diagnosis not present

## 2023-04-02 DIAGNOSIS — C61 Malignant neoplasm of prostate: Secondary | ICD-10-CM | POA: Diagnosis not present

## 2023-04-02 MED ORDER — TADALAFIL (PAH) 20 MG PO TABS
20.0000 mg | ORAL_TABLET | Freq: Every day | ORAL | 11 refills | Status: AC | PRN
  Filled 2023-04-02: qty 10, 30d supply, fill #0
  Filled 2023-10-05: qty 10, 30d supply, fill #1
  Filled 2023-11-11: qty 10, 30d supply, fill #2
  Filled 2023-12-29: qty 10, 30d supply, fill #3
  Filled 2024-02-09: qty 10, 30d supply, fill #4
  Filled 2024-03-13: qty 10, 30d supply, fill #5

## 2023-05-05 ENCOUNTER — Other Ambulatory Visit: Payer: Self-pay

## 2023-05-06 DIAGNOSIS — H524 Presbyopia: Secondary | ICD-10-CM | POA: Diagnosis not present

## 2023-06-09 ENCOUNTER — Other Ambulatory Visit: Payer: Self-pay

## 2023-07-08 ENCOUNTER — Other Ambulatory Visit: Payer: Self-pay

## 2023-08-11 ENCOUNTER — Other Ambulatory Visit: Payer: Self-pay

## 2023-09-04 ENCOUNTER — Other Ambulatory Visit: Payer: Self-pay

## 2023-09-28 ENCOUNTER — Other Ambulatory Visit
Admission: RE | Admit: 2023-09-28 | Discharge: 2023-09-28 | Disposition: A | Discharge status: Home / Self Care | Source: Ambulatory Visit | Attending: Urology | Admitting: Urology

## 2023-09-28 DIAGNOSIS — C61 Malignant neoplasm of prostate: Secondary | ICD-10-CM | POA: Diagnosis not present

## 2023-09-28 LAB — PSA: Prostatic Specific Antigen: 19.45 ng/mL — ABNORMAL HIGH (ref 0.00–4.00)

## 2023-10-05 ENCOUNTER — Other Ambulatory Visit (HOSPITAL_COMMUNITY): Payer: Self-pay

## 2023-10-05 ENCOUNTER — Other Ambulatory Visit: Payer: Self-pay

## 2023-11-11 ENCOUNTER — Other Ambulatory Visit: Payer: Self-pay

## 2023-12-02 ENCOUNTER — Other Ambulatory Visit: Payer: Self-pay

## 2023-12-02 DIAGNOSIS — N401 Enlarged prostate with lower urinary tract symptoms: Secondary | ICD-10-CM | POA: Diagnosis not present

## 2023-12-02 DIAGNOSIS — C61 Malignant neoplasm of prostate: Secondary | ICD-10-CM | POA: Diagnosis not present

## 2023-12-02 DIAGNOSIS — N5201 Erectile dysfunction due to arterial insufficiency: Secondary | ICD-10-CM | POA: Diagnosis not present

## 2023-12-02 MED ORDER — SULFAMETHOXAZOLE-TRIMETHOPRIM 800-160 MG PO TABS
1.0000 | ORAL_TABLET | ORAL | 0 refills | Status: AC
  Filled 2023-12-02: qty 2, 1d supply, fill #0

## 2023-12-29 ENCOUNTER — Other Ambulatory Visit: Payer: Self-pay

## 2024-01-05 ENCOUNTER — Other Ambulatory Visit: Payer: Self-pay

## 2024-01-05 MED ORDER — COMIRNATY 30 MCG/0.3ML IM SUSY
0.3000 mL | PREFILLED_SYRINGE | Freq: Once | INTRAMUSCULAR | 0 refills | Status: AC
  Filled 2024-01-05: qty 0.3, 1d supply, fill #0

## 2024-01-21 ENCOUNTER — Other Ambulatory Visit: Payer: Self-pay | Admitting: Gastroenterology

## 2024-01-21 ENCOUNTER — Other Ambulatory Visit: Payer: Self-pay

## 2024-01-21 MED ORDER — AZITHROMYCIN 250 MG PO TABS
ORAL_TABLET | ORAL | 0 refills | Status: AC
  Filled 2024-01-21: qty 6, 5d supply, fill #0

## 2024-01-21 NOTE — Progress Notes (Signed)
 Patient with an URI and in need ofantibiotics

## 2024-01-29 ENCOUNTER — Other Ambulatory Visit: Payer: Self-pay | Admitting: Unknown Physician Specialty

## 2024-01-29 DIAGNOSIS — R972 Elevated prostate specific antigen [PSA]: Secondary | ICD-10-CM | POA: Diagnosis not present

## 2024-01-29 DIAGNOSIS — R399 Unspecified symptoms and signs involving the genitourinary system: Secondary | ICD-10-CM | POA: Diagnosis not present

## 2024-02-09 ENCOUNTER — Other Ambulatory Visit: Payer: Self-pay

## 2024-02-10 ENCOUNTER — Other Ambulatory Visit: Payer: Self-pay

## 2024-02-10 MED ORDER — TADALAFIL 20 MG PO TABS
20.0000 mg | ORAL_TABLET | Freq: Every day | ORAL | 6 refills | Status: AC | PRN
  Filled 2024-02-10: qty 30, 30d supply, fill #0
  Filled 2024-04-19: qty 30, 30d supply, fill #1

## 2024-02-16 NOTE — Progress Notes (Signed)
 Path requested from [facility].   Slide custody agreement faxed successfully.

## 2024-03-10 LAB — SURGICAL PATHOLOGY

## 2024-03-13 ENCOUNTER — Other Ambulatory Visit: Payer: Self-pay

## 2024-03-14 ENCOUNTER — Other Ambulatory Visit: Payer: Self-pay

## 2024-03-14 MED ORDER — AMLODIPINE BESYLATE 10 MG PO TABS
10.0000 mg | ORAL_TABLET | Freq: Every day | ORAL | 4 refills | Status: AC
  Filled 2024-03-14: qty 90, 90d supply, fill #0

## 2024-03-14 MED ORDER — VALSARTAN-HYDROCHLOROTHIAZIDE 320-12.5 MG PO TABS
1.0000 | ORAL_TABLET | Freq: Every day | ORAL | 4 refills | Status: AC
  Filled 2024-03-14: qty 90, 90d supply, fill #0

## 2024-03-18 DIAGNOSIS — C61 Malignant neoplasm of prostate: Secondary | ICD-10-CM | POA: Diagnosis not present

## 2024-03-29 ENCOUNTER — Other Ambulatory Visit (HOSPITAL_COMMUNITY): Payer: Self-pay | Admitting: Urology

## 2024-03-29 DIAGNOSIS — C61 Malignant neoplasm of prostate: Secondary | ICD-10-CM

## 2024-04-15 ENCOUNTER — Other Ambulatory Visit: Payer: Self-pay

## 2024-04-16 ENCOUNTER — Other Ambulatory Visit: Payer: Self-pay

## 2024-04-18 ENCOUNTER — Encounter (HOSPITAL_COMMUNITY)
Admission: RE | Admit: 2024-04-18 | Discharge: 2024-04-18 | Disposition: A | Discharge status: Home / Self Care | Source: Ambulatory Visit | Attending: Urology | Admitting: Urology

## 2024-04-18 DIAGNOSIS — C61 Malignant neoplasm of prostate: Secondary | ICD-10-CM | POA: Insufficient documentation

## 2024-04-18 MED ORDER — FLOTUFOLASTAT F 18 GALLIUM 296-5846 MBQ/ML IV SOLN
8.5000 | Freq: Once | INTRAVENOUS | Status: AC
  Administered 2024-04-18: 8.5 via INTRAVENOUS

## 2024-04-19 ENCOUNTER — Other Ambulatory Visit: Payer: Self-pay

## 2024-04-20 ENCOUNTER — Other Ambulatory Visit: Payer: Self-pay

## 2024-05-04 ENCOUNTER — Other Ambulatory Visit: Payer: Self-pay

## 2024-05-10 ENCOUNTER — Ambulatory Visit: Admitting: Radiation Oncology

## 2024-05-18 ENCOUNTER — Inpatient Hospital Stay

## 2024-05-18 ENCOUNTER — Inpatient Hospital Stay: Admitting: Internal Medicine

## 2024-06-29 ENCOUNTER — Inpatient Hospital Stay
